# Patient Record
Sex: Male | Born: 1955 | Race: White | Hispanic: No | Smoking: Former smoker
Health system: Southern US, Community
[De-identification: ages and names within clinical notes are randomized; demographics above are authoritative.]

## PROBLEM LIST (undated history)

## (undated) DIAGNOSIS — C801 Malignant (primary) neoplasm, unspecified: Secondary | ICD-10-CM

## (undated) DIAGNOSIS — I10 Essential (primary) hypertension: Secondary | ICD-10-CM

## (undated) DIAGNOSIS — E119 Type 2 diabetes mellitus without complications: Secondary | ICD-10-CM

## (undated) HISTORY — PX: LYMPH NODE BIOPSY: SHX201

---

## 2013-11-14 DIAGNOSIS — C801 Malignant (primary) neoplasm, unspecified: Secondary | ICD-10-CM

## 2013-11-14 HISTORY — DX: Malignant (primary) neoplasm, unspecified: C80.1

## 2014-07-08 ENCOUNTER — Emergency Department (HOSPITAL_COMMUNITY): Payer: BLUE CROSS/BLUE SHIELD

## 2014-07-08 ENCOUNTER — Encounter (HOSPITAL_COMMUNITY): Payer: Self-pay | Admitting: Intensive Care

## 2014-07-08 ENCOUNTER — Emergency Department (HOSPITAL_COMMUNITY)
Admission: EM | Admit: 2014-07-08 | Discharge: 2014-07-08 | Disposition: A | Discharge status: Home / Self Care | Payer: BLUE CROSS/BLUE SHIELD | Attending: Emergency Medicine | Admitting: Emergency Medicine

## 2014-07-08 DIAGNOSIS — E119 Type 2 diabetes mellitus without complications: Secondary | ICD-10-CM | POA: Diagnosis not present

## 2014-07-08 DIAGNOSIS — Z79899 Other long term (current) drug therapy: Secondary | ICD-10-CM | POA: Diagnosis not present

## 2014-07-08 DIAGNOSIS — Z8589 Personal history of malignant neoplasm of other organs and systems: Secondary | ICD-10-CM | POA: Diagnosis not present

## 2014-07-08 DIAGNOSIS — R55 Syncope and collapse: Secondary | ICD-10-CM | POA: Insufficient documentation

## 2014-07-08 DIAGNOSIS — Z87891 Personal history of nicotine dependence: Secondary | ICD-10-CM | POA: Diagnosis not present

## 2014-07-08 HISTORY — DX: Type 2 diabetes mellitus without complications: E11.9

## 2014-07-08 HISTORY — DX: Malignant (primary) neoplasm, unspecified: C80.1

## 2014-07-08 LAB — COMPREHENSIVE METABOLIC PANEL
ALK PHOS: 45 U/L (ref 39–117)
ALT: 16 U/L (ref 0–53)
AST: 14 U/L (ref 0–37)
Albumin: 3.1 g/dL — ABNORMAL LOW (ref 3.5–5.2)
Anion gap: 8 (ref 5–15)
BUN: 17 mg/dL (ref 6–23)
CO2: 27 mmol/L (ref 19–32)
CREATININE: 0.85 mg/dL (ref 0.50–1.35)
Calcium: 8.7 mg/dL (ref 8.4–10.5)
Chloride: 97 mmol/L (ref 96–112)
GFR calc Af Amer: >90 mL/min (ref >90)
GLUCOSE: 168 mg/dL — AB (ref 70–99)
POTASSIUM: 3.1 mmol/L — AB (ref 3.5–5.1)
Sodium: 132 mmol/L — ABNORMAL LOW (ref 135–145)
TOTAL PROTEIN: 5 g/dL — AB (ref 6.0–8.3)
Total Bilirubin: 0.6 mg/dL (ref 0.3–1.2)

## 2014-07-08 LAB — URINALYSIS, ROUTINE W REFLEX MICROSCOPIC
BILIRUBIN URINE: NEGATIVE
Glucose, UA: NEGATIVE mg/dL
HGB URINE DIPSTICK: NEGATIVE
Ketones, ur: NEGATIVE mg/dL
LEUKOCYTES UA: NEGATIVE
Nitrite: NEGATIVE
Protein, ur: NEGATIVE mg/dL
SPECIFIC GRAVITY, URINE: 1.014 (ref 1.005–1.030)
UROBILINOGEN UA: 0.2 mg/dL (ref 0.0–1.0)
pH: 6.5 (ref 5.0–8.0)

## 2014-07-08 LAB — CBC WITH DIFFERENTIAL/PLATELET
BASOS PCT: 0 % (ref 0–1)
Basophils Absolute: 0 10*3/uL (ref 0.0–0.1)
EOS PCT: 1 % (ref 0–5)
Eosinophils Absolute: 0.1 10*3/uL (ref 0.0–0.7)
HEMATOCRIT: 32.7 % — AB (ref 39.0–52.0)
Hemoglobin: 11.4 g/dL — ABNORMAL LOW (ref 13.0–17.0)
LYMPHS ABS: 0.7 10*3/uL (ref 0.7–4.0)
Lymphocytes Relative: 6 % — ABNORMAL LOW (ref 12–46)
MCH: 31.2 pg (ref 26.0–34.0)
MCHC: 34.9 g/dL (ref 30.0–36.0)
MCV: 89.6 fL (ref 78.0–100.0)
MONO ABS: 0.1 10*3/uL (ref 0.1–1.0)
Monocytes Relative: 1 % — ABNORMAL LOW (ref 3–12)
Neutro Abs: 11.4 10*3/uL — ABNORMAL HIGH (ref 1.7–7.7)
Neutrophils Relative %: 92 % — ABNORMAL HIGH (ref 43–77)
PLATELETS: 150 10*3/uL (ref 150–400)
RBC: 3.65 MIL/uL — ABNORMAL LOW (ref 4.22–5.81)
RDW: 14.8 % (ref 11.5–15.5)
WBC: 12.2 10*3/uL — ABNORMAL HIGH (ref 4.0–10.5)

## 2014-07-08 LAB — CBG MONITORING, ED
Glucose-Capillary: 139 mg/dL — ABNORMAL HIGH (ref 70–99)
Glucose-Capillary: 140 mg/dL — ABNORMAL HIGH (ref 70–99)

## 2014-07-08 LAB — TROPONIN I: Troponin I: <0.03 ng/mL (ref <0.031)

## 2014-07-08 MED ORDER — SODIUM CHLORIDE 0.9 % IV SOLN
1000.0000 mL | INTRAVENOUS | Status: DC
  Administered 2014-07-08: 1000 mL via INTRAVENOUS

## 2014-07-08 NOTE — ED Notes (Signed)
Pt back from X-ray.  

## 2014-07-08 NOTE — ED Notes (Signed)
Arrived by [facility]. PT was in checkout line at [employer], pt stated to girlfriend he did not feel good and then passed out for 15-20 seconds losing LOC. Pt was pale and diaphoretic. EMS stated patient lost LOC for 10-15 seconds while with them. Per EMS patient was clammy and diaphoretic. Orthostatics with EMS Systolic 423 to 76, laying to sitting. Started new chemo pill 4 days go (Ibrutinib).Pt denies urinary symptoms, chx pain, N/V. Pt has discomfort R flank

## 2014-07-08 NOTE — ED Provider Notes (Signed)
CSN: [id number]     Arrival date & time 07/08/14  1411 History   First MD Initiated Contact with Patient 07/08/14 1507     Chief Complaint  Patient presents with  . Loss of Consciousness     (Consider location/radiation/quality/duration/timing/severity/associated sxs/prior Treatment) HPI patient presents after an episode of syncope. About one hour prior to my evaluation the patient was standing at a store. He felt lightheaded, diaphoretic, loss consciousness for 10/15 seconds. The patient was actually lowered to the floor, so no trauma. Upon awakening, and prior to the event he had no chest pain. He states that he returned to baseline cognition fairly quickly, and currently has no complaint. Patient has a notable history of non-Hodgkin's lymphoma, has completed several rounds of chemotherapy, and started a new oral chemotherapy regimen 4 days ago. He acknowledges a history of hypertension, but other than the cancer is generally well. History of coronary disease, arrhythmia. Patient is scheduled to see oncology tomorrow.  Past Medical History  Diagnosis Date  . Diabetes mellitus without complication   . Cancer    Past Surgical History  Procedure Laterality Date  . Lymph node biopsy     History reviewed. No pertinent family history. History  Substance Use Topics  . Smoking status: Former Research scientist (life sciences)  . Smokeless tobacco: Not on file  . Alcohol Use: Yes     Comment: occassionally    Review of Systems  Constitutional:       Per HPI, otherwise negative  HENT:       Per HPI, otherwise negative  Respiratory:       Per HPI, otherwise negative  Cardiovascular:       Per HPI, otherwise negative  Gastrointestinal: Negative for vomiting.  Endocrine:       Negative aside from HPI  Genitourinary:       Neg aside from HPI   Musculoskeletal:       Per HPI, otherwise negative  Skin: Negative.   Allergic/Immunologic: Positive for immunocompromised state.  Neurological: Positive for  syncope.      Allergies  Review of patient's allergies indicates not on file.  Home Medications   Prior to Admission medications   Medication Sig Start Date End Date Taking? Authorizing Provider  acetaminophen (TYLENOL) 500 MG tablet Take 1,000 mg by mouth every 6 (six) hours as needed.   Yes Historical Provider, MD  Cholecalciferol (VITAMIN D3) 5000 UNITS CAPS Take 5,000 Units by mouth daily.   Yes Historical Provider, MD  hydrochlorothiazide (HYDRODIURIL) 25 MG tablet Take 25 mg by mouth daily. 06/13/14  Yes Historical Provider, MD  ibrutinib (IMBRUVICA) 140 MG capsul Take 560 mg by mouth daily.   Yes Historical Provider, MD  losartan (COZAAR) 50 MG tablet Take 50 mg by mouth daily. 06/20/14  Yes Historical Provider, MD  metFORMIN (GLUCOPHAGE) 500 MG tablet Take 500 mg by mouth 2 (two) times daily with a meal. 06/20/14  Yes Historical Provider, MD  neomycin-bacitracin-polymyxin (NEOSPORIN) OINT Apply 1 application topically as needed for irritation or wound care.   Yes Historical Provider, MD  predniSONE (DELTASONE) 50 MG tablet Take 50 mg by mouth 2 (two) times daily. Takes for 5 days starting one day after chemo 03/31/14   Historical Provider, MD   BP 124/51 mmHg  Pulse 61  Temp(Src) 97.6 F (36.4 C) (Oral)  Resp 18  Ht 5\' 11"  (1.803 m)  Wt 200 lb (90.719 kg)  BMI 27.91 kg/m2  SpO2 100% Physical Exam  Constitutional: He is oriented to  person, place, and time. He appears well-developed. No distress.  HENT:  Head: Normocephalic and atraumatic.  Eyes: Conjunctivae and EOM are normal.  Cardiovascular: Normal rate and regular rhythm.   Pulmonary/Chest: Effort normal. No stridor. No respiratory distress.  Abdominal: He exhibits no distension.  Musculoskeletal: He exhibits no edema.  Neurological: He is alert and oriented to person, place, and time. No cranial nerve deficit. He exhibits normal muscle tone. Coordination normal.  Skin: Skin is warm and dry.  Psychiatric: He has a normal  mood and affect.  Nursing note and vitals reviewed.   ED Course  Procedures (including critical care time) Labs Review Labs Reviewed  CBC WITH DIFFERENTIAL/PLATELET - Abnormal; Notable for the following:    WBC 12.2 (*)    RBC 3.65 (*)    Hemoglobin 11.4 (*)    HCT 32.7 (*)    Neutrophils Relative % 92 (*)    Neutro Abs 11.4 (*)    Lymphocytes Relative 6 (*)    Monocytes Relative 1 (*)    All other components within normal limits  COMPREHENSIVE METABOLIC PANEL - Abnormal; Notable for the following:    Sodium 132 (*)    Potassium 3.1 (*)    Glucose, Bld 168 (*)    Total Protein 5.0 (*)    Albumin 3.1 (*)    All other components within normal limits  CBG MONITORING, ED - Abnormal; Notable for the following:    Glucose-Capillary 139 (*)    All other components within normal limits  CBG MONITORING, ED - Abnormal; Notable for the following:    Glucose-Capillary 140 (*)    All other components within normal limits  TROPONIN I  URINALYSIS, ROUTINE W REFLEX MICROSCOPIC  POCT CBG (FASTING - GLUCOSE)-MANUAL ENTRY    Imaging Review Dg Chest 2 View  07/08/2014   CLINICAL DATA:  Syncope. Receiving chemotherapy for Hodgkin's lymphoma.  EXAM: CHEST  2 VIEW  COMPARISON:  None.  FINDINGS: The heart size and mediastinal contours are within normal limits. Left-sided Port-A-Cath with the tip over the left brachiocephalic-SVC junction. Both lungs are clear. The visualized skeletal structures are unremarkable.  IMPRESSION: No active cardiopulmonary disease.   Electronically Signed   By: Kathreen Devoid   On: 07/08/2014 17:09     EKG Interpretation   Date/Time:  Sunday July 08 2014 14:17:00 EDT Ventricular Rate:  62 PR Interval:  159 QRS Duration: 103 QT Interval:  419 QTC Calculation: 425 R Axis:   78 Text Interpretation:  Sinus rhythm Consider left atrial enlargement  Nonspecific T abnormalities, lateral leads Minimal ST elevation, inferior  leads Sinus rhythm T wave abnormality  Abnormal ekg Confirmed by Carmin Muskrat  MD [phone]) on 07/08/2014 3:52:46 PM        EMS rhythm strip shows rate 98, T-wave changes, artifact, abnormal Pulse oximetry 100% room air normal Review of patient's chart demonstrates multiple oncology visits, no cardiac history.  I reviewed the results (including imaging as performed), agree with the interpretation  On repeat exam the patient appears better.  We reviewed all findings.   MDM   Final diagnoses:  Syncope and collapse    Patient, currently undergoing chemotherapy, presents after syncope. Here the patient is essentially a symptomatically, and for hours of monitoring has no arrhythmia, no evidence for occult infection. Patient may be feeling effects of recent hospitalization, with dehydration versus fatigue. No evidence for pulmonary embolism given the absence of hypoxia, tachypnea, tachycardia. Patient reason started new medication, which may be arrhythmogenic,  but again, no evidence for that currently. Patient has capacity for close follow-up, will call his oncologist, on discharge, be seen tomorrow for additional repeated interventions.     Carmin Muskrat, MD 07/08/14 [phone]

## 2014-07-08 NOTE — Discharge Instructions (Signed)
As discussed, it is important that you follow up as soon as possible with your physician for continued management of your condition.  With unclear cause for your loss of consciousness, the possibilities require additional evaluation. Please discuss this with your physician. You may consider continuous cardiac monitoring, echocardiogram, additional laboratory evaluation.  If you develop any new, or concerning changes in your condition, please return to the emergency department immediately.

## 2015-09-19 ENCOUNTER — Inpatient Hospital Stay (HOSPITAL_COMMUNITY)
Admission: EM | Admit: 2015-09-19 | Discharge: 2015-09-27 | DRG: 314 | Disposition: A | Discharge status: Skilled Nursing Facility | Payer: BLUE CROSS/BLUE SHIELD | Attending: Internal Medicine | Admitting: Internal Medicine

## 2015-09-19 ENCOUNTER — Encounter (HOSPITAL_COMMUNITY): Payer: Self-pay

## 2015-09-19 ENCOUNTER — Emergency Department (HOSPITAL_COMMUNITY): Payer: BLUE CROSS/BLUE SHIELD

## 2015-09-19 DIAGNOSIS — Z87891 Personal history of nicotine dependence: Secondary | ICD-10-CM

## 2015-09-19 DIAGNOSIS — M79606 Pain in leg, unspecified: Secondary | ICD-10-CM

## 2015-09-19 DIAGNOSIS — Z7984 Long term (current) use of oral hypoglycemic drugs: Secondary | ICD-10-CM

## 2015-09-19 DIAGNOSIS — E119 Type 2 diabetes mellitus without complications: Secondary | ICD-10-CM | POA: Diagnosis present

## 2015-09-19 DIAGNOSIS — Z515 Encounter for palliative care: Secondary | ICD-10-CM | POA: Diagnosis present

## 2015-09-19 DIAGNOSIS — Z880 Allergy status to penicillin: Secondary | ICD-10-CM

## 2015-09-19 DIAGNOSIS — A419 Sepsis, unspecified organism: Secondary | ICD-10-CM | POA: Diagnosis not present

## 2015-09-19 DIAGNOSIS — R609 Edema, unspecified: Secondary | ICD-10-CM

## 2015-09-19 DIAGNOSIS — M79651 Pain in right thigh: Secondary | ICD-10-CM

## 2015-09-19 DIAGNOSIS — C8330 Diffuse large B-cell lymphoma, unspecified site: Secondary | ICD-10-CM | POA: Diagnosis present

## 2015-09-19 DIAGNOSIS — I959 Hypotension, unspecified: Secondary | ICD-10-CM | POA: Diagnosis not present

## 2015-09-19 DIAGNOSIS — M25551 Pain in right hip: Secondary | ICD-10-CM | POA: Diagnosis present

## 2015-09-19 DIAGNOSIS — M79604 Pain in right leg: Secondary | ICD-10-CM | POA: Diagnosis not present

## 2015-09-19 DIAGNOSIS — Z881 Allergy status to other antibiotic agents status: Secondary | ICD-10-CM

## 2015-09-19 DIAGNOSIS — E861 Hypovolemia: Secondary | ICD-10-CM | POA: Diagnosis present

## 2015-09-19 DIAGNOSIS — Z79891 Long term (current) use of opiate analgesic: Secondary | ICD-10-CM

## 2015-09-19 DIAGNOSIS — J90 Pleural effusion, not elsewhere classified: Secondary | ICD-10-CM | POA: Diagnosis present

## 2015-09-19 DIAGNOSIS — G893 Neoplasm related pain (acute) (chronic): Secondary | ICD-10-CM | POA: Diagnosis not present

## 2015-09-19 DIAGNOSIS — F05 Delirium due to known physiological condition: Secondary | ICD-10-CM | POA: Diagnosis not present

## 2015-09-19 DIAGNOSIS — T80219A Unspecified infection due to central venous catheter, initial encounter: Secondary | ICD-10-CM | POA: Diagnosis present

## 2015-09-19 DIAGNOSIS — Z79899 Other long term (current) drug therapy: Secondary | ICD-10-CM

## 2015-09-19 DIAGNOSIS — I9589 Other hypotension: Secondary | ICD-10-CM

## 2015-09-19 DIAGNOSIS — E872 Acidosis, unspecified: Secondary | ICD-10-CM | POA: Diagnosis present

## 2015-09-19 DIAGNOSIS — T80211A Bloodstream infection due to central venous catheter, initial encounter: Principal | ICD-10-CM | POA: Diagnosis present

## 2015-09-19 DIAGNOSIS — Z7189 Other specified counseling: Secondary | ICD-10-CM | POA: Diagnosis present

## 2015-09-19 DIAGNOSIS — R627 Adult failure to thrive: Secondary | ICD-10-CM | POA: Diagnosis present

## 2015-09-19 DIAGNOSIS — Z801 Family history of malignant neoplasm of trachea, bronchus and lung: Secondary | ICD-10-CM

## 2015-09-19 DIAGNOSIS — E871 Hypo-osmolality and hyponatremia: Secondary | ICD-10-CM | POA: Diagnosis present

## 2015-09-19 DIAGNOSIS — B999 Unspecified infectious disease: Secondary | ICD-10-CM | POA: Diagnosis present

## 2015-09-19 DIAGNOSIS — Z808 Family history of malignant neoplasm of other organs or systems: Secondary | ICD-10-CM

## 2015-09-19 DIAGNOSIS — I358 Other nonrheumatic aortic valve disorders: Secondary | ICD-10-CM | POA: Diagnosis present

## 2015-09-19 DIAGNOSIS — X58XXXA Exposure to other specified factors, initial encounter: Secondary | ICD-10-CM | POA: Diagnosis present

## 2015-09-19 DIAGNOSIS — S90822A Blister (nonthermal), left foot, initial encounter: Secondary | ICD-10-CM | POA: Diagnosis present

## 2015-09-19 DIAGNOSIS — I10 Essential (primary) hypertension: Secondary | ICD-10-CM | POA: Diagnosis present

## 2015-09-19 DIAGNOSIS — A4101 Sepsis due to Methicillin susceptible Staphylococcus aureus: Secondary | ICD-10-CM | POA: Diagnosis present

## 2015-09-19 DIAGNOSIS — Z807 Family history of other malignant neoplasms of lymphoid, hematopoietic and related tissues: Secondary | ICD-10-CM

## 2015-09-19 DIAGNOSIS — R634 Abnormal weight loss: Secondary | ICD-10-CM | POA: Diagnosis present

## 2015-09-19 DIAGNOSIS — I890 Lymphedema, not elsewhere classified: Secondary | ICD-10-CM | POA: Diagnosis present

## 2015-09-19 DIAGNOSIS — I110 Hypertensive heart disease with heart failure: Secondary | ICD-10-CM | POA: Diagnosis present

## 2015-09-19 DIAGNOSIS — Z66 Do not resuscitate: Secondary | ICD-10-CM | POA: Diagnosis present

## 2015-09-19 DIAGNOSIS — I5020 Unspecified systolic (congestive) heart failure: Secondary | ICD-10-CM | POA: Diagnosis present

## 2015-09-19 DIAGNOSIS — R6521 Severe sepsis with septic shock: Secondary | ICD-10-CM | POA: Diagnosis present

## 2015-09-19 LAB — HEPATIC FUNCTION PANEL
ALK PHOS: 42 U/L (ref 25–125)
ALK PHOS: 42 U/L (ref 38–126)
ALT: 25 U/L (ref 10–40)
ALT: 25 U/L (ref 17–63)
AST: 31 U/L (ref 14–40)
AST: 31 U/L (ref 15–41)
Albumin: 3.6 g/dL (ref 3.5–5.0)
BILIRUBIN DIRECT: 0.2 mg/dL (ref 0.1–0.5)
BILIRUBIN, TOTAL: 0.6 mg/dL
Indirect Bilirubin: 0.4 mg/dL (ref 0.3–0.9)
TOTAL PROTEIN: 6.2 g/dL — AB (ref 6.5–8.1)
Total Bilirubin: 0.6 mg/dL (ref 0.3–1.2)

## 2015-09-19 LAB — CBC WITH DIFFERENTIAL/PLATELET
BASOS PCT: 0 %
Basophils Absolute: 0 10*3/uL (ref 0.0–0.1)
EOS ABS: 0 10*3/uL (ref 0.0–0.7)
Eosinophils Relative: 0 %
HCT: 29.4 % — ABNORMAL LOW (ref 39.0–52.0)
Hemoglobin: 10.2 g/dL — ABNORMAL LOW (ref 13.0–17.0)
Lymphocytes Relative: 1 %
Lymphs Abs: 0.1 10*3/uL — ABNORMAL LOW (ref 0.7–4.0)
MCH: 30.7 pg (ref 26.0–34.0)
MCHC: 34.7 g/dL (ref 30.0–36.0)
MCV: 88.6 fL (ref 78.0–100.0)
Monocytes Absolute: 0.2 10*3/uL (ref 0.1–1.0)
Monocytes Relative: 2 %
Neutro Abs: 9.2 10*3/uL — ABNORMAL HIGH (ref 1.7–7.7)
Neutrophils Relative %: 97 %
Platelets: 194 10*3/uL (ref 150–400)
RBC: 3.32 MIL/uL — AB (ref 4.22–5.81)
RDW: 15.3 % (ref 11.5–15.5)
WBC: 9.5 10*3/uL (ref 4.0–10.5)

## 2015-09-19 LAB — BLOOD CULTURE ID PANEL (REFLEXED)
ACINETOBACTER BAUMANNII: NOT DETECTED
CANDIDA KRUSEI: NOT DETECTED
CANDIDA PARAPSILOSIS: NOT DETECTED
CARBAPENEM RESISTANCE: NOT DETECTED
Candida albicans: NOT DETECTED
Candida glabrata: NOT DETECTED
Candida tropicalis: NOT DETECTED
ENTEROBACTERIACEAE SPECIES: NOT DETECTED
ENTEROCOCCUS SPECIES: NOT DETECTED
Enterobacter cloacae complex: NOT DETECTED
Escherichia coli: NOT DETECTED
Haemophilus influenzae: NOT DETECTED
KLEBSIELLA OXYTOCA: NOT DETECTED
KLEBSIELLA PNEUMONIAE: NOT DETECTED
LISTERIA MONOCYTOGENES: NOT DETECTED
Methicillin resistance: NOT DETECTED
Neisseria meningitidis: NOT DETECTED
Proteus species: NOT DETECTED
Pseudomonas aeruginosa: NOT DETECTED
SERRATIA MARCESCENS: NOT DETECTED
STAPHYLOCOCCUS AUREUS BCID: DETECTED — AB
STAPHYLOCOCCUS SPECIES: DETECTED — AB
Streptococcus agalactiae: NOT DETECTED
Streptococcus pneumoniae: NOT DETECTED
Streptococcus pyogenes: NOT DETECTED
Streptococcus species: NOT DETECTED
VANCOMYCIN RESISTANCE: NOT DETECTED

## 2015-09-19 LAB — BASIC METABOLIC PANEL
Anion gap: 12 (ref 5–15)
BUN: 27 mg/dL — ABNORMAL HIGH (ref 6–20)
BUN: 27 mg/dL — AB (ref 4–21)
CO2: 23 mmol/L (ref 22–32)
CREATININE: 0.72 mg/dL (ref 0.61–1.24)
Calcium: 9.9 mg/dL (ref 8.9–10.3)
Chloride: 94 mmol/L — ABNORMAL LOW (ref 101–111)
Creatinine: 0.7 mg/dL (ref 0.6–1.3)
GFR calc non Af Amer: >60 mL/min (ref >60)
Glucose, Bld: 72 mg/dL (ref 65–99)
Glucose: 72 mg/dL
Potassium: 3.6 mmol/L (ref 3.4–5.3)
Potassium: 3.6 mmol/L (ref 3.5–5.1)
SODIUM: 129 mmol/L — AB (ref 135–145)
SODIUM: 129 mmol/L — AB (ref 137–147)

## 2015-09-19 LAB — CBC AND DIFFERENTIAL
HEMATOCRIT: 29 % — AB (ref 41–53)
HEMOGLOBIN: 10.2 g/dL — AB (ref 13.5–17.5)
PLATELETS: 194 10*3/uL (ref 150–399)
WBC: 9.5 10^3/mL

## 2015-09-19 LAB — URINALYSIS, ROUTINE W REFLEX MICROSCOPIC
Bilirubin Urine: NEGATIVE
GLUCOSE, UA: NEGATIVE mg/dL
Hgb urine dipstick: NEGATIVE
Ketones, ur: NEGATIVE mg/dL
LEUKOCYTES UA: NEGATIVE
Nitrite: NEGATIVE
PROTEIN: NEGATIVE mg/dL
SPECIFIC GRAVITY, URINE: 1.02 (ref 1.005–1.030)
pH: 6 (ref 5.0–8.0)

## 2015-09-19 LAB — I-STAT CG4 LACTIC ACID, ED
LACTIC ACID, VENOUS: 4.51 mmol/L — AB (ref 0.5–1.9)
LACTIC ACID, VENOUS: 3.35 mmol/L — AB (ref 0.5–1.9)

## 2015-09-19 LAB — ACETAMINOPHEN LEVEL

## 2015-09-19 MED ORDER — FENTANYL CITRATE (PF) 100 MCG/2ML IJ SOLN
50.0000 ug | INTRAMUSCULAR | Status: DC | PRN
  Administered 2015-09-19 (×2): 50 ug via INTRAVENOUS
  Filled 2015-09-19 (×3): qty 2

## 2015-09-19 MED ORDER — HYDROMORPHONE HCL 1 MG/ML IJ SOLN
1.0000 mg | Freq: Once | INTRAMUSCULAR | Status: AC
  Administered 2015-09-19: 1 mg via INTRAVENOUS
  Filled 2015-09-19: qty 1

## 2015-09-19 MED ORDER — DIPHENHYDRAMINE HCL 12.5 MG/5ML PO ELIX: 12.5000 mg | ORAL_SOLUTION | Freq: Four times a day (QID) | ORAL | Status: DC | PRN

## 2015-09-19 MED ORDER — DIPHENHYDRAMINE HCL 50 MG/ML IJ SOLN: 12.5000 mg | Freq: Four times a day (QID) | INTRAMUSCULAR | Status: DC | PRN

## 2015-09-19 MED ORDER — ONDANSETRON HCL 4 MG/2ML IJ SOLN: 4.0000 mg | Freq: Four times a day (QID) | INTRAMUSCULAR | Status: DC | PRN

## 2015-09-19 MED ORDER — ONDANSETRON HCL 4 MG PO TABS: 4.0000 mg | ORAL_TABLET | Freq: Four times a day (QID) | ORAL | Status: DC | PRN

## 2015-09-19 MED ORDER — SODIUM CHLORIDE 0.9 % IV BOLUS (SEPSIS)
1000.0000 mL | Freq: Once | INTRAVENOUS | Status: AC
  Administered 2015-09-19: 1000 mL via INTRAVENOUS

## 2015-09-19 MED ORDER — PIPERACILLIN-TAZOBACTAM 3.375 G IVPB 30 MIN
3.3750 g | Freq: Once | INTRAVENOUS | Status: AC
  Administered 2015-09-19: 3.375 g via INTRAVENOUS
  Filled 2015-09-19: qty 50

## 2015-09-19 MED ORDER — DEXAMETHASONE SODIUM PHOSPHATE 10 MG/ML IJ SOLN
8.0000 mg | Freq: Four times a day (QID) | INTRAMUSCULAR | Status: DC
  Administered 2015-09-19 – 2015-09-21 (×7): 8 mg via INTRAVENOUS
  Administered 2015-09-21: 10 mg via INTRAVENOUS
  Filled 2015-09-19 (×8): qty 1

## 2015-09-19 MED ORDER — SODIUM CHLORIDE 0.9 % IV BOLUS (SEPSIS)
700.0000 mL | Freq: Once | INTRAVENOUS | Status: AC
  Administered 2015-09-19: 700 mL via INTRAVENOUS

## 2015-09-19 MED ORDER — NALOXONE HCL 0.4 MG/ML IJ SOLN: 0.4000 mg | INTRAMUSCULAR | Status: DC | PRN

## 2015-09-19 MED ORDER — FENTANYL 50 MCG/HR TD PT72
50.0000 ug | MEDICATED_PATCH | TRANSDERMAL | Status: DC
  Administered 2015-09-19 – 2015-09-25 (×3): 50 ug via TRANSDERMAL
  Filled 2015-09-19: qty 2
  Filled 2015-09-19 (×2): qty 1

## 2015-09-19 MED ORDER — LINEZOLID 600 MG/300ML IV SOLN
600.0000 mg | Freq: Two times a day (BID) | INTRAVENOUS | Status: DC
  Administered 2015-09-19: 600 mg via INTRAVENOUS
  Filled 2015-09-19 (×2): qty 300

## 2015-09-19 MED ORDER — SODIUM CHLORIDE 0.9 % IV SOLN
INTRAVENOUS | Status: DC
  Administered 2015-09-19 – 2015-09-25 (×7): via INTRAVENOUS

## 2015-09-19 MED ORDER — DEXTROSE 5 % IV SOLN
2.0000 g | Freq: Once | INTRAVENOUS | Status: AC
  Administered 2015-09-19: 2 g via INTRAVENOUS
  Filled 2015-09-19 (×2): qty 2

## 2015-09-19 MED ORDER — SODIUM CHLORIDE 0.9 % IV BOLUS (SEPSIS): 1000.0000 mL | Freq: Once | INTRAVENOUS | Status: DC

## 2015-09-19 MED ORDER — DEXAMETHASONE 4 MG PO TABS: 4.0000 mg | ORAL_TABLET | Freq: Two times a day (BID) | ORAL | Status: DC

## 2015-09-19 MED ORDER — LEVOFLOXACIN IN D5W 750 MG/150ML IV SOLN
750.0000 mg | INTRAVENOUS | Status: DC
  Administered 2015-09-19: 750 mg via INTRAVENOUS
  Filled 2015-09-19: qty 150

## 2015-09-19 MED ORDER — FENTANYL CITRATE (PF) 100 MCG/2ML IJ SOLN
50.0000 ug | Freq: Once | INTRAMUSCULAR | Status: AC
  Administered 2015-09-19: 50 ug via INTRAVENOUS

## 2015-09-19 MED ORDER — ENOXAPARIN SODIUM 40 MG/0.4ML ~~LOC~~ SOLN
40.0000 mg | SUBCUTANEOUS | Status: DC
  Administered 2015-09-19: 40 mg via SUBCUTANEOUS
  Filled 2015-09-19: qty 0.4

## 2015-09-19 MED ORDER — DEXTROSE 5 % IV SOLN
1.0000 g | Freq: Three times a day (TID) | INTRAVENOUS | Status: DC
  Filled 2015-09-19 (×2): qty 1

## 2015-09-19 MED ORDER — ONDANSETRON HCL 4 MG/2ML IJ SOLN
4.0000 mg | Freq: Once | INTRAMUSCULAR | Status: AC
  Administered 2015-09-19: 4 mg via INTRAVENOUS
  Filled 2015-09-19: qty 2

## 2015-09-19 MED ORDER — VANCOMYCIN HCL IN DEXTROSE 1-5 GM/200ML-% IV SOLN: 1000.0000 mg | Freq: Two times a day (BID) | INTRAVENOUS | Status: DC

## 2015-09-19 MED ORDER — DEXAMETHASONE SODIUM PHOSPHATE 4 MG/ML IJ SOLN: 4.0000 mg | Freq: Two times a day (BID) | INTRAMUSCULAR | Status: DC

## 2015-09-19 MED ORDER — FENTANYL 40 MCG/ML IV SOLN
INTRAVENOUS | Status: DC
  Administered 2015-09-19: 25 ug via INTRAVENOUS
  Administered 2015-09-19: 17:00:00 via INTRAVENOUS
  Administered 2015-09-20: 0 ug via INTRAVENOUS
  Administered 2015-09-20: 0.1 ug via INTRAVENOUS
  Administered 2015-09-21: 25 ug via INTRAVENOUS
  Administered 2015-09-21: 0 ug via INTRAVENOUS
  Filled 2015-09-19: qty 25

## 2015-09-19 MED ORDER — SODIUM CHLORIDE 0.9% FLUSH: 9.0000 mL | INTRAVENOUS | Status: DC | PRN

## 2015-09-19 MED ORDER — PIPERACILLIN-TAZOBACTAM 3.375 G IVPB: 3.3750 g | Freq: Three times a day (TID) | INTRAVENOUS | Status: DC

## 2015-09-19 MED ORDER — OXYCODONE HCL 5 MG PO TABS: 5.0000 mg | ORAL_TABLET | ORAL | Status: DC | PRN

## 2015-09-19 MED ORDER — VANCOMYCIN HCL IN DEXTROSE 1-5 GM/200ML-% IV SOLN
1000.0000 mg | Freq: Once | INTRAVENOUS | Status: AC
  Administered 2015-09-19: 1000 mg via INTRAVENOUS
  Filled 2015-09-19: qty 200

## 2015-09-19 MED ORDER — DEXAMETHASONE 2 MG PO TABS: 2.0000 mg | ORAL_TABLET | Freq: Two times a day (BID) | ORAL | Status: DC

## 2015-09-19 MED ORDER — METHYLPREDNISOLONE SODIUM SUCC 125 MG IJ SOLR
60.0000 mg | Freq: Four times a day (QID) | INTRAMUSCULAR | Status: DC
  Administered 2015-09-19: 60 mg via INTRAVENOUS
  Filled 2015-09-19: qty 2

## 2015-09-19 MED ORDER — SODIUM CHLORIDE 0.9 % IV SOLN
Freq: Once | INTRAVENOUS | Status: AC
  Administered 2015-09-19: 12:00:00 via INTRAVENOUS

## 2015-09-19 MED ORDER — ACETAMINOPHEN 325 MG PO TABS
650.0000 mg | ORAL_TABLET | Freq: Four times a day (QID) | ORAL | Status: DC | PRN
  Filled 2015-09-19: qty 2

## 2015-09-19 MED ORDER — FENTANYL 40 MCG/ML IV SOLN
INTRAVENOUS | Status: DC
  Filled 2015-09-19: qty 25

## 2015-09-19 MED ORDER — ACETAMINOPHEN 650 MG RE SUPP: 650.0000 mg | Freq: Four times a day (QID) | RECTAL | Status: DC | PRN

## 2015-09-19 MED ORDER — FAMOTIDINE IN NACL 20-0.9 MG/50ML-% IV SOLN
20.0000 mg | Freq: Two times a day (BID) | INTRAVENOUS | Status: DC
  Administered 2015-09-19 – 2015-09-24 (×10): 20 mg via INTRAVENOUS
  Filled 2015-09-19 (×11): qty 50

## 2015-09-19 NOTE — ED Notes (Signed)
Pt. Is unable to urinate at this time. Urinal left at bedside. 

## 2015-09-19 NOTE — ED Provider Notes (Signed)
CSN: [id number]     Arrival date & time 09/19/15  0301 History   First MD Initiated Contact with Patient 09/19/15 [phone]     Chief Complaint  Patient presents with  . Leg Pain     (Consider location/radiation/quality/duration/timing/severity/associated sxs/prior Treatment) HPI Martin Norton is a 60 y.o. male with history of diabetes, non-Hodgkin's lymphoma associated pain in right leg presents for evaluation of acute leg pain. States this is typical pain pattern for him. Patient reports symptoms have been gradually worsening today, not relieved with fentanyl patch, oxycodone, Aleve and Tylenol at home. Given 150 mg of fentanyl via EMS without relief. Patient typically [facility]. Denies fevers, chills, nausea or vomiting. Reports he is here for pain control.  Next Chemotherapy session is next week. Wife at bedside notes that he takes 25 g fentanyl patch changed every other day, also when necessary oxycodone. She does not know the dose.  Past Medical History  Diagnosis Date  . Diabetes mellitus without complication   . Cancer    Past Surgical History  Procedure Laterality Date  . Lymph node biopsy     No family history on file. Social History  Substance Use Topics  . Smoking status: Former Research scientist (life sciences)  . Smokeless tobacco: Not on file  . Alcohol Use: Yes     Comment: occassionally    Review of Systems A 10 point review of systems was completed and was negative except for pertinent positives and negatives as mentioned in the history of present illness     Allergies  Review of patient's allergies indicates not on file.  Home Medications   Prior to Admission medications   Medication Sig Start Date End Date Taking? Authorizing Provider  acetaminophen (TYLENOL) 500 MG tablet Take 1,000 mg by mouth every 6 (six) hours as needed.    Historical Provider, MD  Cholecalciferol (VITAMIN D3) 5000 UNITS CAPS Take 5,000 Units by mouth daily.    Historical  Provider, MD  hydrochlorothiazide (HYDRODIURIL) 25 MG tablet Take 25 mg by mouth daily. 06/13/14   Historical Provider, MD  ibrutinib (IMBRUVICA) 140 MG capsul Take 560 mg by mouth daily.    Historical Provider, MD  losartan (COZAAR) 50 MG tablet Take 50 mg by mouth daily. 06/20/14   Historical Provider, MD  metFORMIN (GLUCOPHAGE) 500 MG tablet Take 500 mg by mouth 2 (two) times daily with a meal. 06/20/14   Historical Provider, MD  neomycin-bacitracin-polymyxin (NEOSPORIN) OINT Apply 1 application topically as needed for irritation or wound care.    Historical Provider, MD  predniSONE (DELTASONE) 50 MG tablet Take 50 mg by mouth 2 (two) times daily. Takes for 5 days starting one day after chemo 03/31/14   Historical Provider, MD   BP 115/83 mmHg  Pulse 118  Temp(Src) 98.4 F (36.9 C) (Oral)  SpO2 100% Physical Exam  Constitutional: He is oriented to person, place, and time. He appears well-developed and well-nourished.  HENT:  Head: Normocephalic and atraumatic.  Mouth/Throat: Oropharynx is clear and moist.  Eyes: Conjunctivae are normal. Pupils are equal, round, and reactive to light. Right eye exhibits no discharge. Left eye exhibits no discharge. No scleral icterus.  Neck: Neck supple.  Cardiovascular: Normal rate, regular rhythm and normal heart sounds.   Pulmonary/Chest: Effort normal and breath sounds normal. No respiratory distress. He has no wheezes. He has no rales.  Abdominal: Soft. There is no tenderness.  Musculoskeletal: He exhibits no tenderness.  Diffuse lymphedema about left lower extremity  which is baseline. Tenderness is located in right anterior thigh. Muscle compartments are soft. No erythema, induration. Full active range of motion  Neurological: He is alert and oriented to person, place, and time.  Cranial Nerves II-XII grossly intact. Motor strength is 5/5 in all 4 extremities. Sensation intact to light touch. No ataxia  Skin: Skin is warm and dry. No rash noted.   Psychiatric: He has a normal mood and affect.  Nursing note and vitals reviewed.   ED Course  Procedures (including critical care time) Labs Review Labs Reviewed  BASIC METABOLIC PANEL - Abnormal; Notable for the following:    Sodium 129 (*)    Chloride 94 (*)    BUN 27 (*)    All other components within normal limits  CBC WITH DIFFERENTIAL/PLATELET - Abnormal; Notable for the following:    RBC 3.32 (*)    Hemoglobin 10.2 (*)    HCT 29.4 (*)    Neutro Abs 9.2 (*)    Lymphs Abs 0.1 (*)    All other components within normal limits    Imaging Review No results found. I have personally reviewed and evaluated these images and lab results as part of my medical decision-making.   EKG Interpretation None      MDM  Patient with left leg lymphedema secondary to non-Hodgkin's lymphoma with acute right anterior thigh pain. On arrival, hemodynamically stable, afebrile, mild tachycardia-likely secondary to pain. Physical exam is unremarkable and not concerning for DVT, compartment syndrome or other emergent pathology. Attempted pain control and emergency department with 3 g of Dilaudid, patient denies any significant relief. Patient may benefit from overnight medical admission for pain control. Discussed with my attending, Dr. Roxanne Mins, who agrees with above plan. Discussed with hospitalist, Dr. Hal Hope, requests MR imaging to further elucidate source of patient's pain. Patient care signed out to oncoming provider for follow-up on MR imaging and subsequent medical admission for pain control. Final diagnoses:  Right thigh pain        Comer Locket, PA-C [phone] [id number]  Delora Fuel, MD [id number] [phone]

## 2015-09-19 NOTE — ED Notes (Signed)
Pt brought in by EMS with complaints of right leg pain that has not improved with prescribed medications at home.  Pt has a hx of chronic lymphedema. Pt usually has tx for lymphedema at [facility]. 150mg  of Fentayl given by EMS. +2-3 edema noted in BLE.

## 2015-09-19 NOTE — Progress Notes (Signed)
      INFECTIOUS DISEASE ATTENDING ADDENDUM:   Date: 09/19/2015  Patient name: Martin Norton  Medical record number: [id number]  Date of birth: [birth year]    Patient with MSSA bacteremia.   He has reported "lip swelling" with zosyn. Not sure if pt has had ceph since the but will continue zyvox for now  Will see formally in the AM   [facility] 09/19/2015, 10:34 PM

## 2015-09-19 NOTE — ED Notes (Signed)
PA at bedside,  

## 2015-09-19 NOTE — ED Notes (Signed)
Hospitalist at bedside 

## 2015-09-19 NOTE — ED Provider Notes (Signed)
6:12 AM Pt signed out to me at shift change. Pt with hx of lymphoma, here with right leg pain. Pt with hx of chronic lympadema, caused by a pelvic mass, followed by [facility]. Currently on Chemo, last chemo on 6/22. Was recently seen by PCP at [facility], records reviewed, for this same pain. Currently on fentanyl and oxycodone. Pain is not controlled at home. Received 170mcg of fentanyl by EMS. Pt received total of 3mg  of dilaudid here in 1mg  increments. Admission was attempted, but hospitalist requested MR lumbar spine and leg.   Just got approached by RN, blood pressue 65/42, same on recheck. Will give IV bolus.  Pt states his pain is "OK" at this time. Pt states "I know my pain is from my cancer, its all in my pelvis and hips, and they told me I have about 3 months to live, but right now, I cant take this pain." MRI pending.   8:10 AM BP still in90s after 2L. Ordered lactic acid and blood cultures, CXR. Will call for admission.  8:46 AM Spoke with hospitalist. Refuse admission until lactic acid is back. Asked to call back.   8:55 AM Lactic acid is 4.5. Sepsis called. Antibiotics ordered. BP now 143/132? Seems inaccurate, will recheck.   9:40 AM Pt's manual blood pressure in [id number] systolic, however 0000000 manually. He is receiving IV fluids and antibiotics. Pt continues to complain of pain in right hip and thigh. He has no other complaints or source for possible infection. WBC normal. Pt states he has not been eating or drinking well. Will admit for further evaluation and tx.   i did confirm with pt. Wants all treatment including pressors except for Intubation and CPR.   Repeat sepsis assessment completed.    10:00 AM I initially spoke with hospitalist, concerned about elevated lactic acid and still low blood pressures in [id number] systolic. Asked to speak with critical care. I did speak with critical care. They will come by and see patient.  Critical care seen patient. They confirmed with patient  that he now does not want any pressors. They recommended admission to palliative floor to medicine. They will contact [facility] for admission.  Filed Vitals:   09/19/15 1336 09/19/15 1400 09/19/15 1430 09/19/15 1500  BP: 87/61 101/42 92/56 73/40   Pulse: 114 117 108 106  Temp:      TempSrc:      Resp: 12 20 18 19   Weight:      SpO2: 94% 93% 94% 97%     Jeannett Senior, PA-C [id number]  David Glick, MD [id number] [phone]

## 2015-09-19 NOTE — ED Notes (Signed)
PA made aware of BP. Bolus ordered and initiated

## 2015-09-19 NOTE — ED Notes (Signed)
Bed: [id number] Expected date:  Expected time:  Means of arrival:  Comments: Chronic pain, cancer patient

## 2015-09-19 NOTE — Consult Note (Signed)
Consultation Note Date: 09/19/2015   Patient Name: Martin Norton  DOB: Nov 25, 1955  MRN: [id number]  Age / Sex: 60 y.o., male  PCP: Red Christians, MD Referring Physician: Elmarie Shiley, MD  Reason for Consultation: Pain control  HPI/Patient Profile: 60 y.o. male  with past medical history of DM II, HTN,  refractory diffuse large B-Cell Lymphoma treated at [facility], has had extensive treatment for lymphoma presented on 09/19/2015 with intractable lower extremity pain.   Clinical Assessment and Goals of Care: I spoke earlier today with Noe Gens from [facility]. Adjustments at that time included increasing fentanyl patch to 37mg and adjusting rescue medications. We discussed case and based upon his continued uncontrolled pain and unknown need to control pain, I recommended initiation of PCA.    I met today with patient and his girlfriend/HCPOA at the bedside.  At time of encounter, PCA was just being set up and he reported that he currently had no pain.  We discussed pain prior to coming to the hospital and he reports that he was using fentanyl 226m patch and 76m40mxycodone with no relief.  No relief with dilaudid in ED and partial relief with IV push of fentanyl.  He reports in the past that he had somewhat similar pain that had responded to steroids.  He is still taking dexamethasone 2mg80mally daily.  He thinks that he may be feeling better now as he had higher dose of steroids in ED.   SUMMARY OF RECOMMENDATIONS   - Patient with pain much better controlled at time of my encounter.  It has been a few hours since his last rescue dose.  I am not sure if his pain is improved secondary to receiving steroids or increase in fentanyl patch, but will plan to continue with PCA overnight in order to get a clearer understanding of his opioid needs.  Fentanyl 276mc41mlus with 15 minute lockout. - His long term girlfriend is his  HCPOA.  Copy of this placed on chart. - Plan to continue with current therapy and see how he responds.  Plan for family meeting at 1130 38rrow morning to discuss long term goals moving forward.  No escalation of care if he decompensates. - He understands that increasing doses of opioids has potential for decreasing BP.  His main focus moving forward is to ensure he is as pain free and comfortable as possible.    Code Status/Advance Care Planning:  DNR   Symptom Management:   As above  Palliative Prophylaxis:   Frequent Pain Assessment  Psycho-social/Spiritual:   Desire for further Chaplaincy support:Did not address this visit  Additional Recommendations: Caregiving  Support/Resources  Prognosis:   Unable to determine, but he remains at high risk for continued decompensation and death  Discharge Planning: To Be Determined      Primary Diagnoses: Present on Admission:  . Right thigh pain . Diffuse large B-cell lymphoma (HCC) [place]ial hypotension . Lactic acid acidosis . Hypotension  I have reviewed the medical record, interviewed the patient  and family, and examined the patient. The following aspects are pertinent.  Past Medical History  Diagnosis Date  . Diabetes mellitus without complication ([place])   . Cancer [facility])    Social History   Social History  . Marital Status: Divorced    Spouse Name: N/A  . Number of Children: N/A  . Years of Education: N/A   Social History Main Topics  . Smoking status: Former Smoker -- 0.50 packs/day for 25 years    Types: Cigarettes  . Smokeless tobacco: Never Used  . Alcohol Use: 1.8 oz/week    3 Cans of beer per week     Comment: occassionally  . Drug Use: No  . Sexual Activity: Not Currently   Other Topics Concern  . None   Social History Narrative   Family History  Problem Relation Age of Onset  . Lymphoma Mother   . Lung cancer Father   . Throat cancer Brother    Scheduled Meds: . dexamethasone  8 mg  Intravenous Q6H  . enoxaparin (LOVENOX) injection  40 mg Subcutaneous Q24H  . famotidine (PEPCID) IV  20 mg Intravenous Q12H  . fentaNYL  50 mcg Transdermal Q72H  . fentaNYL   Intravenous Q4H  . linezolid (ZYVOX) IV  600 mg Intravenous Q12H   Continuous Infusions: . sodium chloride 100 mL/hr at 09/19/15 2222   PRN Meds:.acetaminophen **OR** acetaminophen, diphenhydrAMINE **OR** diphenhydrAMINE, ondansetron (ZOFRAN) IV, ondansetron **OR** ondansetron (ZOFRAN) IV Medications Prior to Admission:  Prior to Admission medications   Medication Sig Start Date End Date Taking? Authorizing Provider  acetaminophen (TYLENOL) 500 MG tablet Take 1,000 mg by mouth every 6 (six) hours as needed for mild pain.    Yes Historical Provider, MD  Cholecalciferol (VITAMIN D3) 5000 UNITS CAPS Take 5,000 Units by mouth daily.   Yes Historical Provider, MD  dexamethasone (DECADRON) 1 MG tablet Take 2 mg by mouth 2 (two) times daily with a meal.  08/19/15  Yes Historical Provider, MD  fentaNYL (DURAGESIC) 25 MCG/HR patch Place 25 mcg onto the skin every 3 (three) days. 09/17/15  Yes Historical Provider, MD  losartan (COZAAR) 50 MG tablet Take 50 mg by mouth daily after supper.  06/20/14  Yes Historical Provider, MD  metFORMIN (GLUCOPHAGE) 500 MG tablet Take 500 mg by mouth 2 (two) times daily with a meal. 06/20/14  Yes Historical Provider, MD  oxyCODONE (OXY IR/ROXICODONE) 5 MG immediate release tablet Take 5 mg by mouth every 4 (four) hours as needed for moderate pain.    Yes Historical Provider, MD  traMADol (ULTRAM) 50 MG tablet Take 50 mg by mouth every 6 (six) hours as needed for moderate pain.  08/21/15  Yes Historical Provider, MD   Allergies  Allergen Reactions  . Vancomycin Swelling    Lip swelling   . Zosyn [Piperacillin Sod-Tazobactam So]     Lip swelling    Review of Systems  Constitutional: Positive for activity change and fatigue.  Gastrointestinal: Positive for abdominal pain.  Musculoskeletal: Positive  for back pain.  Psychiatric/Behavioral: Positive for sleep disturbance.    Physical Exam  Eyes: PERRL, lids and conjunctivae normal ENMT: Mucous membranes are dry . Posterior pharynx clear of any exudate or lesions.Normal dentition. Upper lip with swelling.  Neck: normal, supple, no masses, no thyromegaly Respiratory: clear to auscultation bilaterally, no wheezing, no crackles. Normal respiratory effort. No accessory muscle use.  Cardiovascular: Regular rate and rhythm, no murmurs / rubs / gallops. No extremity edema. 2+ pedal pulses. No carotid  bruits.  Abdomen: no tenderness, no masses palpated. No hepatosplenomegaly. Bowel sounds positive.  Musculoskeletal: no clubbing / cyanosis. No joint deformity upper and lower extremities. Good ROM, no contractures. Normal muscle tone. Bilateral LE edema worse left  Skin: no rashes, lesions, ulcers. No induration Neurologic: CN 2-12 grossly intact. Sensation intact, DTR normal. Strength 5/5 in all 4.  Psychiatric: Normal judgment and insight. Alert and oriented x 3. Normal mood.   Vital Signs: BP 104/65 mmHg  Pulse 101  Temp(Src) 99.7 F (37.6 C) (Oral)  Resp 18  Ht 5' 11"  (1.803 m)  Wt 92.2 kg (203 lb 4.2 oz)  BMI 28.36 kg/m2  SpO2 99% Pain Assessment: 0-10 POSS *See Group Information*: 1-Acceptable,Awake and alert Pain Score: 0-No pain   SpO2: SpO2: 99 % O2 Device:SpO2: 99 % O2 Flow Rate: .O2 Flow Rate (L/min): 2 L/min  IO: Intake/output summary: No intake or output data in the 24 hours ending 09/19/15 2258  LBM: Last BM Date: 09/18/15 Baseline Weight: Weight: 86.183 kg (190 lb) Most recent weight: Weight: 92.2 kg (203 lb 4.2 oz)     Palliative Assessment/Data: 30%     Time In: 1525 Time Out: 1645 Time Total: 80 Greater than 50%  of this time was spent counseling and coordinating care related to the above assessment and plan.  Signed by: Micheline Rough, MD   Please contact Palliative Medicine Team phone at [phone]  for questions and concerns.  For individual provider: See Shea Evans

## 2015-09-19 NOTE — Consult Note (Signed)
PULMONARY / CRITICAL CARE MEDICINE   Name: Martin Norton MRN: [id number] DOB: 08-May-1955    ADMISSION DATE:  09/19/2015 CONSULTATION DATE:  09/19/15  REFERRING MD:  Dr. Tomi Bamberger   CHIEF COMPLAINT:  Hypotension   HISTORY OF PRESENT ILLNESS:  60 y/o M, former smoker, with PMH of DM II, HTN, refractory diffuse large B-Cell Lymphoma treated at [facility] (see treatment hx below), failure to thrive, and weight loss who presented to [facility] on 09/19/15 with complaints of right hip / thigh pain.    The patient was diagnosed with large B-cell lymphoma in 11/2013.  From notes at [facility] >> "He was treated with R-CHOP 86/7672 with complications of neutropenia.  He was later transitioned to RICE and RR-Ibrutinib-EPOCH without significant response. He was then treated with R-GDP with a mixed response to therapy. He was then referred to radiation for consideration of therapy to the left inguinal region which he completed 11/2014. He then participated in a phase II clinical trial using Nivolumab and IDO inhibitor (Epacadostat) and had progressive disease. Most recently, he received radiation to the right pelvic mass and completed treatment in February 2017 with significant improvement in his lymphedema. 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 He was referred to [facility] for CAR-T cell therapy and is being considered for clinical trial. Therapy will likely take place this summer. We initiated therapy with Rituxan-Bendamustine in an attempt to keep his disease stable until he could undergo CAR-T cell treatment. He however progressed rapidly after 1 cycle. Therapy was changed to Brentuximab".  His course was complicated by right thigh / hip swelling with associated pain.  He suffered mechanical falls at home.  To date, he is on the wait list for CAR-T therapy for late / summer fall 2017 (however, if felt the disease is not under control, he will not be a candidate).  He has also had difficulty with decreased appetite and weight loss of  approximately 30 lbs. The last notes reflect he had been referred to Palliative Care.    On presentation to ER at [facility] via EMS he complained of R leg pain that was not relieved by prescription narcotics at home.  The patient is currently treated with fentanyl patach, oxy IR, tramadol.  He has been using up to 7 500 mg tylenol and aleve to help with pain.  The patient was noted to be hypotensive and treated with volume resuscitation.  Lactic acid returned at 4.51.  Labs otherwise (not changed from baseline) - Na 129, K 3.6, Cl 94, BUN 27, Sr Cr 0.72, glucose 72, alk phos 42, AST 31 / ALT 25, WBC 9.5, Hgb 10.2, and platelets 194.  Despite fluids, BP only improved to 09'O systolic.  Given lactic acid & hypotension, the patient was empirically treated with vancomycin / zosyn and fluid resuscitation.    PCCM called for evaluation.     PAST MEDICAL HISTORY :  He  has a past medical history of Diabetes mellitus without complication ([place]) and Cancer ([place]).  PAST SURGICAL HISTORY: He  has past surgical history that includes Lymph node biopsy.  No Known Allergies  No current facility-administered medications on file prior to encounter.   Current Outpatient Prescriptions on File Prior to Encounter  Medication Sig  . acetaminophen (TYLENOL) 500 MG tablet Take 1,000 mg by mouth every 6 (six) hours as needed for mild pain.   . Cholecalciferol (VITAMIN D3) 5000 UNITS CAPS Take 5,000 Units by mouth daily.  Marland Kitchen losartan (COZAAR) 50 MG tablet Take  50 mg by mouth daily after supper.   . metFORMIN (GLUCOPHAGE) 500 MG tablet Take 500 mg by mouth 2 (two) times daily with a meal.    FAMILY HISTORY:  His has no family status information on file.   SOCIAL HISTORY: He  reports that he has quit smoking. His smoking use included Cigarettes. He has a 12.5 pack-year smoking history. He has never used smokeless tobacco. He reports that he drinks about 1.8 oz of alcohol per week. He reports that he does not use illicit  drugs.  REVIEW OF SYSTEMS:  POSITIVES IN BOLD Gen: Denies fever, chills, weight change, fatigue, night sweats HEENT: Denies blurred vision, double vision, hearing loss, tinnitus, sinus congestion, rhinorrhea, sore throat, neck stiffness, dysphagia PULM: Denies shortness of breath, cough, sputum production, hemoptysis, wheezing CV: Denies chest pain, edema, orthopnea, paroxysmal nocturnal dyspnea, palpitations GI: Denies abdominal pain, nausea, vomiting, diarrhea, hematochezia, melena, constipation, change in bowel habits GU: Denies dysuria, hematuria, polyuria, oliguria, urethral discharge Endocrine: Denies hot or cold intolerance, polyuria, polyphagia or appetite change Derm: Denies rash, dry skin, scaling or peeling skin change Heme: Denies easy bruising, bleeding, bleeding gums Neuro: Denies headache, numbness, weakness, slurred speech, loss of memory or consciousness MSK:  Right hip / thigh pain   SUBJECTIVE:  Pt reports pain in right hip & thigh that has not been controlled by home regimen.    VITAL SIGNS: BP 88/55 mmHg  Pulse 120  Temp(Src) 97.7 F (36.5 C) (Oral)  Resp 15  Wt 190 lb (86.183 kg)  SpO2 98%  HEMODYNAMICS:    VENTILATOR SETTINGS:    INTAKE / OUTPUT:    PHYSICAL EXAMINATION: General:  Chronically ill appearing adult male, appears uncomfortable Neuro:  AAOx4, speech clear, MAE  HEENT:  MM pink/dry, no jvd Cardiovascular:  s1s2 rrr Lungs:  Even/non-labored, lungs bilaterally clear  Abdomen:  Obese/soft, bsx4 active  Musculoskeletal:  No acute deformities, BLE 3+ pitting edema  Skin:  Warm/dry  LABS:  BMET  Recent Labs Lab 09/19/15 0416  NA 129*  K 3.6  CL 94*  CO2 23  BUN 27*  CREATININE 0.72  GLUCOSE 72    Electrolytes  Recent Labs Lab 09/19/15 0416  CALCIUM 9.9    CBC  Recent Labs Lab 09/19/15 0416  WBC 9.5  HGB 10.2*  HCT 29.4*  PLT 194    Coag's No results for input(s): APTT, INR in the last 168 hours.  Sepsis  Markers  Recent Labs Lab 09/19/15 0847  LATICACIDVEN 4.51*    ABG No results for input(s): PHART, PCO2ART, PO2ART in the last 168 hours.  Liver Enzymes  Recent Labs Lab 09/19/15 0422  AST 31  ALT 25  ALKPHOS 42  BILITOT 0.6  ALBUMIN 3.6    Cardiac Enzymes No results for input(s): TROPONINI, PROBNP in the last 168 hours.  Glucose No results for input(s): GLUCAP in the last 168 hours.  Imaging Dg Chest Port 1 View  09/19/2015  CLINICAL DATA:  Non-Hodgkin's lymphoma. EXAM: PORTABLE CHEST 1 VIEW COMPARISON:  None. FINDINGS: There is pleural thickening along the lateral upper right hemithorax, measuring 6.1 x 2.3 cm. There is no edema or consolidation. The heart is borderline enlarged with pulmonary vascularity within normal limits. Atherosclerotic calcification is noted in the aorta. No adenopathy is evident. There are no blastic or lytic bone lesions evident. Port-A-Cath tip is in the superior vena cava. No evident pneumothorax. There is calcification in the left carotid artery. IMPRESSION: Pleural thickening on the right laterally.  Chronicity of this finding uncertain. Given history of lymphoma and this pleural thickening, correlation with contrast enhanced chest CT may be of value to further assess. No edema or consolidation. Heart borderline enlarged. There is aortic atherosclerosis. There is also left carotid artery calcification. No adenopathy is demonstrable by radiography. Electronically Signed   By: Lowella Grip III M.D.   On: 09/19/2015 09:30     STUDIES:  CT Chest / ABD / Pelvis 07/24/15 [facility]) >> new RUL lesion abutting the R major fissure 2x2 cm, GGO RUL, subpleural nodules unchanged, increase in size of retroperitoneal nodes / masses, lower right retroperitoneal mass encasing the IVC, R pelvic sidewall mass increased, bladder wall thickening   CULTURES: BCx2 7/6 >>  UA 7/6 >>  UC 7/6 >>   ANTIBIOTICS: Vancomycin 7/6 >>  Zosyn 7/6 >>   SIGNIFICANT  EVENTS: 7/06  Admit to [facility] with complaints of right hip / thigh pain  DISCUSSION: 60 y/o M with PMH of DM II, HTN, and diffuse B-cell lymphoma (Dx in 11/2013, recently given approx 3 months to live per ONC) who presented to [facility] 7/6 with complaints of significant R hip / thigh pain, unresponsive to home narcotics.   ASSESSMENT / PLAN:  1.  Refractory diffuse B-Cell Lymphoma - followed at [facility], 07/2015 CT abd / pelvis shows increased size of right retroperitoneal mass that encases the IVC as well as pelvic wall mass (in addition to other areas).   Recommend hold off on further imaging given goals of care.  If patient stabilizes and wishes to proceed, consider CT of abd / pelvis in am if he wants to pursue further imaging. ? If pt would be a candidate at this point for further palliative radiation.  2.  Refractory Pain - in setting of refractory diffuse lymphoma.  Patients primary goal is pain control at this point.  He has been on home fentanyl 8mg patch, oxy IR 5 mg, and tramadol. Have requested palliative care for an urgent pain control consult and transition to possible facility care for EOL when appropriate.  He has limited family support as an outpatient. Discussed pain control with Palliative Care MD, will initiate fentanyl PCA with basal rate.  Additional IVF for hydration to aide in pain control. Continue home decadron but will increase to 495mBID.    3.  Hypotension / Elevated Lactate - no occult evidence of sepsis on presentation.  However, patient accepting of medical therapies to review for infection (blood / urine cultures, empiric abx).  He understands that to achieve adequate pain control that his blood pressure may be affected.    4.  Goals of Care - extensive discussion with patient and girlfriend regarding his overall state of health, what his understanding of his cancer is as relayed to him by his primary ONC, his primary goals of care (which he currently states to be pain control), he  is accepting of medical therapies that would aide in comfort.  DNR/DNI/no pressors.      FAMILY  - Updates: Patient and Girlfriend (pt's caretaker) updated at bedside.   - Inter-disciplinary family meet or Palliative Care meeting due by:  Ongoing   BrNoe GensNP-C [facility] Pgr: [phone] or if no answer [phone]/08/2015, 12:26 PM   CC:  Dr. ZaLeonie Man

## 2015-09-19 NOTE — H&P (Signed)
History and Physical    Martin Norton [id number] DOB: 1955/07/17 DOA: 09/19/2015  PCP: Red Christians, MD  Patient coming from; Home   Chief Complaint: legs pain.   HPI: Martin Norton is a 60 y.o. 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 He report his pain is severe, pain medications not helping at home. He denies cough, chest pain or diarrhea.   ED Course: he was found to have lactic acidosis, no clear source for infection, he became hypotensive in the ED SBP 60---80. CCM consulted. CCM discussed with patient and fiance, patient is DNR, no vasopressors. Pain management. He agree with IV fluids, IV antibiotics.   Patient also notice swelling of his upper lips, this develop here in the ED. No other symptoms,no rash, no difficulty swallowing or tongue swelling.    Review of Systems: As per HPI otherwise 10 point review of systems negative.    Past Medical History  Diagnosis Date  . Diabetes mellitus without complication ([place])   . Cancer [facility])     Past Surgical History  Procedure Laterality Date  . Lymph node biopsy       reports that he has quit smoking. His smoking use included Cigarettes. He has a 12.5 pack-year smoking history. He has never used smokeless tobacco. He reports that he drinks about 1.8 oz of alcohol per week. He reports that he does not use illicit drugs.  Allergies  Allergen Reactions  . Vancomycin Swelling    Lip swelling   . Zosyn [Piperacillin Sod-Tazobactam So]     Lip swelling     Family History  Problem Relation Age of Onset  . Lymphoma Mother   . Lung cancer Father   . Throat cancer Brother      Prior to Admission medications   Medication Sig Start Date End Date Taking? Authorizing Provider  acetaminophen  (TYLENOL) 500 MG tablet Take 1,000 mg by mouth every 6 (six) hours as needed for mild pain.    Yes Historical Provider, MD  Cholecalciferol (VITAMIN D3) 5000 UNITS CAPS Take 5,000 Units by mouth daily.   Yes Historical Provider, MD  dexamethasone (DECADRON) 1 MG tablet Take 2 mg by mouth 2 (two) times daily with a meal.  08/19/15  Yes Historical Provider, MD  fentaNYL (DURAGESIC) 25 MCG/HR patch Place 25 mcg onto the skin every 3 (three) days. 09/17/15  Yes Historical Provider, MD  losartan (COZAAR) 50 MG tablet Take 50 mg by mouth daily after supper.  06/20/14  Yes Historical Provider, MD  metFORMIN (GLUCOPHAGE) 500 MG tablet Take 500 mg by mouth 2 (two) times daily with a meal. 06/20/14  Yes Historical Provider, MD  oxyCODONE (OXY IR/ROXICODONE) 5 MG immediate release tablet Take 5 mg by mouth every 4 (four) hours as needed for moderate pain.    Yes Historical Provider, MD  traMADol (ULTRAM) 50 MG tablet Take 50 mg by mouth every 6 (six) hours as needed for moderate pain.  08/21/15  Yes Historical Provider, MD    Physical Exam: Filed Vitals:   09/19/15 1215 09/19/15 1300 09/19/15 1336 09/19/15 1400  BP: 64/22 66/38 87/61  101/42  Pulse: 64 64 114 117  Temp:      TempSrc:      Resp: 20 17 12 20   Weight:  SpO2: 97% 100% 94% 93%      Constitutional: NAD, calm, comfortable Filed Vitals:   09/19/15 1215 09/19/15 1300 09/19/15 1336 09/19/15 1400  BP: 64/22 66/38 87/61  101/42  Pulse: 64 64 114 117  Temp:      TempSrc:      Resp: 20 17 12 20   Weight:      SpO2: 97% 100% 94% 93%   Eyes: PERRL, lids and conjunctivae normal ENMT: Mucous membranes are dry . Posterior pharynx clear of any exudate or lesions.Normal dentition. Upper lip with swelling.  Neck: normal, supple, no masses, no thyromegaly Respiratory: clear to auscultation bilaterally, no wheezing, no crackles. Normal respiratory effort. No accessory muscle use.  Cardiovascular: Regular rate and rhythm, no murmurs / rubs / gallops. No  extremity edema. 2+ pedal pulses. No carotid bruits.  Abdomen: no tenderness, no masses palpated. No hepatosplenomegaly. Bowel sounds positive.  Musculoskeletal: no clubbing / cyanosis. No joint deformity upper and lower extremities. Good ROM, no contractures. Normal muscle tone. Bilateral LE edema worse left  Skin: no rashes, lesions, ulcers. No induration Neurologic: CN 2-12 grossly intact. Sensation intact, DTR normal. Strength 5/5 in all 4.  Psychiatric: Normal judgment and insight. Alert and oriented x 3. Normal mood.     Labs on Admission: I have personally reviewed following labs and imaging studies  CBC:  Recent Labs Lab 09/19/15 0416  WBC 9.5  NEUTROABS 9.2*  HGB 10.2*  HCT 29.4*  MCV 88.6  PLT [id number]   Basic Metabolic Panel:  Recent Labs Lab 09/19/15 0416  NA 129*  K 3.6  CL 94*  CO2 23  GLUCOSE 72  BUN 27*  CREATININE 0.72  CALCIUM 9.9   GFR: CrCl cannot be calculated (Unknown ideal weight.). Liver Function Tests:  Recent Labs Lab 09/19/15 0422  AST 31  ALT 25  ALKPHOS 42  BILITOT 0.6  PROT 6.2*  ALBUMIN 3.6   No results for input(s): LIPASE, AMYLASE in the last 168 hours. No results for input(s): AMMONIA in the last 168 hours. Coagulation Profile: No results for input(s): INR, PROTIME in the last 168 hours. Cardiac Enzymes: No results for input(s): CKTOTAL, CKMB, CKMBINDEX, TROPONINI in the last 168 hours. BNP (last 3 results) No results for input(s): PROBNP in the last 8760 hours. HbA1C: No results for input(s): HGBA1C in the last 72 hours. CBG: No results for input(s): GLUCAP in the last 168 hours. Lipid Profile: No results for input(s): CHOL, HDL, LDLCALC, TRIG, CHOLHDL, LDLDIRECT in the last 72 hours. Thyroid Function Tests: No results for input(s): TSH, T4TOTAL, FREET4, T3FREE, THYROIDAB in the last 72 hours. Anemia Panel: No results for input(s): VITAMINB12, FOLATE, FERRITIN, TIBC, IRON, RETICCTPCT in the last 72 hours. Urine  analysis:    Component Value Date/Time   COLORURINE YELLOW 09/19/2015 [place] 09/19/2015 1156   LABSPEC 1.020 09/19/2015 1156   PHURINE 6.0 09/19/2015 1156   GLUCOSEU NEGATIVE 09/19/2015 1156   HGBUR NEGATIVE 09/19/2015 [place] 09/19/2015 1156   KETONESUR NEGATIVE 09/19/2015 1156   PROTEINUR NEGATIVE 09/19/2015 1156   UROBILINOGEN 0.2 07/08/2014 1624   NITRITE NEGATIVE 09/19/2015 1156   LEUKOCYTESUR NEGATIVE 09/19/2015 1156   Sepsis Labs: !!!!!!!!!!!!!!!!!!!!!!!!!!!!!!!!!!!!!!!!!!!! @LABRCNTIP (procalcitonin:4,lacticidven:4) )No results found for this or any previous visit (from the past 240 hour(s)).   Radiological Exams on Admission: Dg Chest Port 1 View  09/19/2015  CLINICAL DATA:  Non-Hodgkin's lymphoma. EXAM: PORTABLE CHEST 1 VIEW COMPARISON:  None. FINDINGS: There is pleural thickening along the  lateral upper right hemithorax, measuring 6.1 x 2.3 cm. There is no edema or consolidation. The heart is borderline enlarged with pulmonary vascularity within normal limits. Atherosclerotic calcification is noted in the aorta. No adenopathy is evident. There are no blastic or lytic bone lesions evident. Port-A-Cath tip is in the superior vena cava. No evident pneumothorax. There is calcification in the left carotid artery. IMPRESSION: Pleural thickening on the right laterally. Chronicity of this finding uncertain. Given history of lymphoma and this pleural thickening, correlation with contrast enhanced chest CT may be of value to further assess. No edema or consolidation. Heart borderline enlarged. There is aortic atherosclerosis. There is also left carotid artery calcification. No adenopathy is demonstrable by radiography. Electronically Signed   By: Lowella Grip III M.D.   On: 09/19/2015 09:30    EKG: Independently reviewed.   Assessment/Plan Active Problems:   Right thigh pain   Diffuse large B-cell lymphoma (HCC)   Arterial hypotension    Lactic acid acidosis  1-Hypotension;  Suspect Hypovolemia, but due to elevated lactic acid will cover for infection .  Develops lips swelling , For this reason will change vanc and Zosyn to Levaquin and aztreonam. Zyvox if it is ok with CCM/  IV bolus. IV decadron will help in event of adrenal insufficiency.  Patient discussed with CCM no IV pressors.   2-Lactic acidosis.  Peak to 4,trending down.  Continue with IV fluids.  Less likely sepsis per CCM/  Continue with IV antibiotics for now.   3-Diffuse large Cell lymphoma;  Has had extensive treatment at [facility].  To date, he is on the wait list for CAR-T therapy for late / summer fall 2017 (however, if felt the disease is not under control, he will not be a candidate).  Palliative consulted for pain management and further goals of care.  Per brandy patient oncologist recomended increase home dose decadron, this will help with pain.   4-Hyponatremia; Suspect related to hypovolemia; IV fluids.  5-Lower extremity edema; check doppler rule out DVT.    DVT prophylaxis: lovenox.  Code Status: DNR  Family Communication: fiance at bedside.  Disposition Plan: to be determine.  Consults called: CCM, palliative care  Admission status: impatient ,med-surgery per CCM    Elmarie Shiley MD [facility] Pager [phone]  If 7PM-7AM, please contact night-coverage [email or web address] Password [email or web address]  09/19/2015, 2:08 PM

## 2015-09-19 NOTE — ED Notes (Signed)
[name]: [phone]

## 2015-09-19 NOTE — Progress Notes (Addendum)
Pharmacy Antibiotic Follow-up Note  Martin Norton is a 60 y.o. year-old male admitted on 09/19/2015.  The patient is currently on day 1 of Vancomycin & Zosyn for probable sepsis. Swelling of upper lip noted in ED, attributed to abx, unable to distinguish if Vancomycin or Zosyn.  Abx changed to Linezolid, Aztreonam, Levaquin for treatment of sepsis.  Assessment/Plan: Linezolid 600mg  q12 Aztreonam 2gm x1, then 1gm q8 Levaquin 750mg  IV q24  Temp (24hrs), Avg:98.4 F (36.9 C), Min:98.4 F (36.9 C), Max:98.4 F (36.9 C)   Recent Labs Lab 09/19/15 0416  WBC 9.5    Recent Labs Lab 09/19/15 0416  CREATININE 0.72   CrCl cannot be calculated (Unknown ideal weight.).    No Known Allergies  Antimicrobials this admission: 7/6 Vancomcyin >> 7/6 7/6 Zosyn >> 7/6 7/6 Linezolid >> 7/6 Aztreonam >> 7/6 Levofloxacin >>  Levels/dose changes this admission:  Microbiology results: 7/6 BCx: collected 7/6 UCx: collected  Thank you for allowing pharmacy to be a part of this patient's care.  Minda Ditto PharmD Pager [phone] 09/19/2015, 2:47 PM

## 2015-09-20 ENCOUNTER — Observation Stay (HOSPITAL_COMMUNITY): Payer: BLUE CROSS/BLUE SHIELD

## 2015-09-20 ENCOUNTER — Observation Stay (HOSPITAL_BASED_OUTPATIENT_CLINIC_OR_DEPARTMENT_OTHER): Payer: BLUE CROSS/BLUE SHIELD

## 2015-09-20 DIAGNOSIS — R509 Fever, unspecified: Secondary | ICD-10-CM | POA: Diagnosis not present

## 2015-09-20 DIAGNOSIS — T80219D Unspecified infection due to central venous catheter, subsequent encounter: Secondary | ICD-10-CM | POA: Diagnosis not present

## 2015-09-20 DIAGNOSIS — Z7189 Other specified counseling: Secondary | ICD-10-CM

## 2015-09-20 DIAGNOSIS — Z807 Family history of other malignant neoplasms of lymphoid, hematopoietic and related tissues: Secondary | ICD-10-CM | POA: Diagnosis not present

## 2015-09-20 DIAGNOSIS — Z7984 Long term (current) use of oral hypoglycemic drugs: Secondary | ICD-10-CM | POA: Diagnosis not present

## 2015-09-20 DIAGNOSIS — E861 Hypovolemia: Secondary | ICD-10-CM | POA: Diagnosis present

## 2015-09-20 DIAGNOSIS — J90 Pleural effusion, not elsewhere classified: Secondary | ICD-10-CM | POA: Diagnosis present

## 2015-09-20 DIAGNOSIS — Z881 Allergy status to other antibiotic agents status: Secondary | ICD-10-CM | POA: Diagnosis not present

## 2015-09-20 DIAGNOSIS — A419 Sepsis, unspecified organism: Secondary | ICD-10-CM | POA: Diagnosis present

## 2015-09-20 DIAGNOSIS — R222 Localized swelling, mass and lump, trunk: Secondary | ICD-10-CM | POA: Diagnosis not present

## 2015-09-20 DIAGNOSIS — Z808 Family history of malignant neoplasm of other organs or systems: Secondary | ICD-10-CM | POA: Diagnosis not present

## 2015-09-20 DIAGNOSIS — I9589 Other hypotension: Secondary | ICD-10-CM | POA: Diagnosis not present

## 2015-09-20 DIAGNOSIS — B999 Unspecified infectious disease: Secondary | ICD-10-CM | POA: Diagnosis present

## 2015-09-20 DIAGNOSIS — R609 Edema, unspecified: Secondary | ICD-10-CM

## 2015-09-20 DIAGNOSIS — Z801 Family history of malignant neoplasm of trachea, bronchus and lung: Secondary | ICD-10-CM | POA: Diagnosis not present

## 2015-09-20 DIAGNOSIS — Z79899 Other long term (current) drug therapy: Secondary | ICD-10-CM | POA: Diagnosis not present

## 2015-09-20 DIAGNOSIS — R931 Abnormal findings on diagnostic imaging of heart and coronary circulation: Secondary | ICD-10-CM | POA: Diagnosis not present

## 2015-09-20 DIAGNOSIS — A4901 Methicillin susceptible Staphylococcus aureus infection, unspecified site: Secondary | ICD-10-CM | POA: Diagnosis not present

## 2015-09-20 DIAGNOSIS — E119 Type 2 diabetes mellitus without complications: Secondary | ICD-10-CM | POA: Diagnosis present

## 2015-09-20 DIAGNOSIS — R7881 Bacteremia: Secondary | ICD-10-CM | POA: Diagnosis not present

## 2015-09-20 DIAGNOSIS — I10 Essential (primary) hypertension: Secondary | ICD-10-CM | POA: Diagnosis not present

## 2015-09-20 DIAGNOSIS — X58XXXA Exposure to other specified factors, initial encounter: Secondary | ICD-10-CM | POA: Diagnosis present

## 2015-09-20 DIAGNOSIS — I110 Hypertensive heart disease with heart failure: Secondary | ICD-10-CM | POA: Diagnosis present

## 2015-09-20 DIAGNOSIS — M79604 Pain in right leg: Secondary | ICD-10-CM | POA: Diagnosis not present

## 2015-09-20 DIAGNOSIS — A4101 Sepsis due to Methicillin susceptible Staphylococcus aureus: Secondary | ICD-10-CM | POA: Diagnosis present

## 2015-09-20 DIAGNOSIS — Z79891 Long term (current) use of opiate analgesic: Secondary | ICD-10-CM | POA: Diagnosis not present

## 2015-09-20 DIAGNOSIS — T80211A Bloodstream infection due to central venous catheter, initial encounter: Secondary | ICD-10-CM | POA: Diagnosis present

## 2015-09-20 DIAGNOSIS — C8330 Diffuse large B-cell lymphoma, unspecified site: Secondary | ICD-10-CM | POA: Diagnosis present

## 2015-09-20 DIAGNOSIS — M79606 Pain in leg, unspecified: Secondary | ICD-10-CM | POA: Diagnosis present

## 2015-09-20 DIAGNOSIS — M25551 Pain in right hip: Secondary | ICD-10-CM | POA: Diagnosis present

## 2015-09-20 DIAGNOSIS — I358 Other nonrheumatic aortic valve disorders: Secondary | ICD-10-CM | POA: Diagnosis present

## 2015-09-20 DIAGNOSIS — E872 Acidosis: Secondary | ICD-10-CM | POA: Diagnosis present

## 2015-09-20 DIAGNOSIS — I340 Nonrheumatic mitral (valve) insufficiency: Secondary | ICD-10-CM | POA: Diagnosis not present

## 2015-09-20 DIAGNOSIS — T80219S Unspecified infection due to central venous catheter, sequela: Secondary | ICD-10-CM | POA: Diagnosis not present

## 2015-09-20 DIAGNOSIS — S90822A Blister (nonthermal), left foot, initial encounter: Secondary | ICD-10-CM | POA: Diagnosis present

## 2015-09-20 DIAGNOSIS — Z95828 Presence of other vascular implants and grafts: Secondary | ICD-10-CM

## 2015-09-20 DIAGNOSIS — I890 Lymphedema, not elsewhere classified: Secondary | ICD-10-CM | POA: Diagnosis present

## 2015-09-20 DIAGNOSIS — E871 Hypo-osmolality and hyponatremia: Secondary | ICD-10-CM | POA: Diagnosis present

## 2015-09-20 DIAGNOSIS — B9561 Methicillin susceptible Staphylococcus aureus infection as the cause of diseases classified elsewhere: Secondary | ICD-10-CM | POA: Diagnosis not present

## 2015-09-20 DIAGNOSIS — R634 Abnormal weight loss: Secondary | ICD-10-CM | POA: Diagnosis present

## 2015-09-20 DIAGNOSIS — G893 Neoplasm related pain (acute) (chronic): Secondary | ICD-10-CM | POA: Diagnosis present

## 2015-09-20 DIAGNOSIS — R6521 Severe sepsis with septic shock: Secondary | ICD-10-CM

## 2015-09-20 DIAGNOSIS — F05 Delirium due to known physiological condition: Secondary | ICD-10-CM | POA: Diagnosis not present

## 2015-09-20 DIAGNOSIS — Z515 Encounter for palliative care: Secondary | ICD-10-CM | POA: Diagnosis present

## 2015-09-20 DIAGNOSIS — Z880 Allergy status to penicillin: Secondary | ICD-10-CM | POA: Diagnosis not present

## 2015-09-20 DIAGNOSIS — Y718 Miscellaneous cardiovascular devices associated with adverse incidents, not elsewhere classified: Secondary | ICD-10-CM | POA: Diagnosis not present

## 2015-09-20 DIAGNOSIS — I5020 Unspecified systolic (congestive) heart failure: Secondary | ICD-10-CM | POA: Diagnosis present

## 2015-09-20 DIAGNOSIS — R627 Adult failure to thrive: Secondary | ICD-10-CM | POA: Diagnosis present

## 2015-09-20 DIAGNOSIS — T80219A Unspecified infection due to central venous catheter, initial encounter: Secondary | ICD-10-CM | POA: Diagnosis present

## 2015-09-20 DIAGNOSIS — Z87891 Personal history of nicotine dependence: Secondary | ICD-10-CM | POA: Diagnosis not present

## 2015-09-20 DIAGNOSIS — R600 Localized edema: Secondary | ICD-10-CM | POA: Diagnosis not present

## 2015-09-20 DIAGNOSIS — Z66 Do not resuscitate: Secondary | ICD-10-CM | POA: Diagnosis present

## 2015-09-20 LAB — BASIC METABOLIC PANEL
Anion gap: 8 (ref 5–15)
BUN: 32 mg/dL — ABNORMAL HIGH (ref 6–20)
CALCIUM: 8.4 mg/dL — AB (ref 8.9–10.3)
CO2: 21 mmol/L — ABNORMAL LOW (ref 22–32)
Chloride: 101 mmol/L (ref 101–111)
Creatinine, Ser: 1.07 mg/dL (ref 0.61–1.24)
Glucose, Bld: 128 mg/dL — ABNORMAL HIGH (ref 65–99)
POTASSIUM: 4.6 mmol/L (ref 3.5–5.1)
SODIUM: 130 mmol/L — AB (ref 135–145)

## 2015-09-20 LAB — CBC
HEMATOCRIT: 26.5 % — AB (ref 39.0–52.0)
HEMOGLOBIN: 9.2 g/dL — AB (ref 13.0–17.0)
MCH: 31.1 pg (ref 26.0–34.0)
MCHC: 34.7 g/dL (ref 30.0–36.0)
MCV: 89.5 fL (ref 78.0–100.0)
Platelets: 135 10*3/uL — ABNORMAL LOW (ref 150–400)
RBC: 2.96 MIL/uL — AB (ref 4.22–5.81)
RDW: 15.9 % — ABNORMAL HIGH (ref 11.5–15.5)
WBC: 11.3 10*3/uL — AB (ref 4.0–10.5)

## 2015-09-20 LAB — ECHOCARDIOGRAM COMPLETE
HEIGHTINCHES: 71 in
WEIGHTICAEL: 3252.23 [oz_av]

## 2015-09-20 LAB — URINE CULTURE: Culture: NO GROWTH

## 2015-09-20 MED ORDER — GADOBENATE DIMEGLUMINE 529 MG/ML IV SOLN
20.0000 mL | Freq: Once | INTRAVENOUS | Status: AC | PRN
  Administered 2015-09-20: 18 mL via INTRAVENOUS

## 2015-09-20 MED ORDER — VANCOMYCIN HCL 10 G IV SOLR
1250.0000 mg | Freq: Two times a day (BID) | INTRAVENOUS | Status: DC
  Administered 2015-09-20 – 2015-09-24 (×9): 1250 mg via INTRAVENOUS
  Filled 2015-09-20 (×10): qty 1250

## 2015-09-20 MED ORDER — HEPARIN SODIUM (PORCINE) 5000 UNIT/ML IJ SOLN
5000.0000 [IU] | Freq: Three times a day (TID) | INTRAMUSCULAR | Status: DC
  Administered 2015-09-21 – 2015-09-27 (×16): 5000 [IU] via SUBCUTANEOUS
  Filled 2015-09-20 (×16): qty 1

## 2015-09-20 NOTE — Progress Notes (Addendum)
*  Preliminary Results* Bilateral lower extremity venous duplex completed. Bilateral lower extremities are negative for deep vein thrombosis. There is no evidence of Baker's cyst bilaterally.  Incidental finding: there is a heterogenous area of the left groin measuring 3.8cm, suggestive of possible enlarged inguinal lymph node, with adjacent heterogenous mass with low-resistant internal vascularity; etiology unknown.  Preliminary results discussed with Dr. Wendee Beavers.  09/20/2015  Maudry Mayhew, RVT, RDCS, RDMS

## 2015-09-20 NOTE — Anesthesia Preprocedure Evaluation (Addendum)
Anesthesia Evaluation  Patient identified by MRN, date of birth, ID band Patient awake    Reviewed: Allergy & Precautions, NPO status , Patient's Chart, lab work & pertinent test results  Airway Mallampati: II  TM Distance: >3 FB Neck ROM: Full    Dental no notable dental hx.    Pulmonary neg pulmonary ROS, former smoker,    Pulmonary exam normal breath sounds clear to auscultation       Cardiovascular negative cardio ROS Normal cardiovascular exam Rhythm:Regular Rate:Normal     Neuro/Psych negative neurological ROS  negative psych ROS   GI/Hepatic negative GI ROS, Neg liver ROS,   Endo/Other  negative endocrine ROSdiabetes  Renal/GU negative Renal ROS     Musculoskeletal negative musculoskeletal ROS (+)   Abdominal   Peds  Hematology negative hematology ROS (+)   Anesthesia Other Findings   Reproductive/Obstetrics negative OB ROS                            Anesthesia Physical Anesthesia Plan  ASA: III  Anesthesia Plan: MAC   Post-op Pain Management:    Induction: Intravenous  Airway Management Planned:   Additional Equipment:   Intra-op Plan:   Post-operative Plan:   Informed Consent: I have reviewed the patients History and Physical, chart, labs and discussed the procedure including the risks, benefits and alternatives for the proposed anesthesia with the patient or authorized representative who has indicated his/her understanding and acceptance.   Dental advisory given  Plan Discussed with: CRNA  Anesthesia Plan Comments:        Anesthesia Quick Evaluation

## 2015-09-20 NOTE — Progress Notes (Signed)
Pharmacy Antibiotic Follow-up Note  Martin Norton is a 60 y.o. year-old male admitted on 09/19/2015.  One dose of Vancomycin & Zosyn given in ED on 7/6, for probable sepsis. Swelling of upper lip noted by girlfriend, attributed to abx, unable to distinguish if Vancomycin or Zosyn.  Abx changed to Linezolid, Aztreonam, Levaquin for treatment of sepsis. No cephalosporin administration found per review in Epic.  ID consulted 7/7, per discussion with Dr. Tommy Medal, ok to change Zyvox to Vancomycin as symptoms more consistent with PCN allergy than vancomycin and patient did not notice them himself.  Assessment/Plan:  Vancomycin 1250mg  IV q12h Check trough at steady state, goal = 15-20 mcg/ml Follow up renal function & repeat blood cultures  Temp (24hrs), Avg:98.9 F (37.2 C), Min:98.4 F (36.9 C), Max:99.7 F (37.6 C)   Recent Labs Lab 09/19/15 0416 09/20/15 0625  WBC 9.5 11.3*     Recent Labs Lab 09/19/15 0416 09/20/15 0625  CREATININE 0.72 1.07   Estimated Creatinine Clearance: 84.2 mL/min (by C-G formula based on Cr of 1.07).    Allergies  Allergen Reactions  . Zosyn [Piperacillin Sod-Tazobactam So]     Lip swelling     Antimicrobials this admission: 7/6 Vanc x 1 7/6 Zosyn x 1 7/6 Levoflox x 1 7/6 Aztreo x 1 7/6 Zyvox >> 7/7 7/7 Vanc >>  Levels/dose changes this admission:  Microbiology results: 7/6 BCx: MSSA per BCID 7/6 UCx: sent 7/7 BCx: 7/7 HIV Ab: 7/7 Hep C Ab:  Thank you for allowing pharmacy to be a part of this patient's care.  Peggyann Juba, PharmD, BCPS Pager: [phone]  09/20/2015, 9:40 AM

## 2015-09-20 NOTE — Progress Notes (Signed)
Daily Progress Note   Patient Name: Martin Norton       Date: 09/20/2015 DOB: Dec 13, 1955  Age: 60 y.o. MRN#: [id number] Attending Physician: Velvet Bathe, MD Primary Care Physician: Red Christians, MD Admit Date: 09/19/2015  Reason for Consultation/Follow-up: Establishing goals of care and Pain control  Subjective: I met today with Mr. Boettner, his wife, and his brother.    We reviewed that his family (brother and girlfriend) are this most important thing to him.  He reports that he also wants to live as well as possible as long as possible but is very clear that he understands that he is approaching end of life due to his cancer.  He would like to continue treatment and workup of current acute infection, however, he is clear that ensuring his comfort is the most important thing to him moving forward in the event of decompensation.  Currently reports that his pain is well controlled.  Rates 0/10 today.  Last BM yesterday and agreeable to starting daily senna as escalation of bowel regimen in light of increased fentanyl dose.   Length of Stay:   Current Medications: Scheduled Meds:  . dexamethasone  8 mg Intravenous Q6H  . famotidine (PEPCID) IV  20 mg Intravenous Q12H  . fentaNYL  50 mcg Transdermal Q72H  . fentaNYL   Intravenous Q4H  . [START ON 09/21/2015] heparin subcutaneous  5,000 Units Subcutaneous Q8H  . vancomycin  1,250 mg Intravenous Q12H    Continuous Infusions: . sodium chloride 100 mL/hr at 09/19/15 2222    PRN Meds: acetaminophen **OR** acetaminophen, diphenhydrAMINE **OR** diphenhydrAMINE, ondansetron (ZOFRAN) IV, ondansetron **OR** ondansetron (ZOFRAN) IV  Physical Exam         Eyes: PERRL, lids and conjunctivae normal ENMT: Mucous membranes are dry . Posterior  pharynx clear of any exudate or lesions.Normal dentition. Upper lip with swelling.  Neck: normal, supple, no masses, no thyromegaly Respiratory: clear to auscultation bilaterally, no wheezing, no crackles. Normal respiratory effort. No accessory muscle use.  Cardiovascular: Regular rate and rhythm, no murmurs / rubs / gallops.No carotid bruits.  Abdomen: no tenderness, no masses palpated. No hepatosplenomegaly. Bowel sounds positive.  Musculoskeletal: no clubbing / cyanosis.  Bilateral LE edema worse left  Skin: no rashes, lesions, ulcers. No induration Psychiatric: Normal judgment and insight. Alert and conversational.  Vital  Signs: BP 125/85 mmHg  Pulse 86  Temp(Src) 97.6 F (36.4 C) (Oral)  Resp 16  Ht 5' 11"  (1.803 m)  Wt 92.2 kg (203 lb 4.2 oz)  BMI 28.36 kg/m2  SpO2 99% SpO2: SpO2: 99 % O2 Device: O2 Device: Not Delivered O2 Flow Rate: O2 Flow Rate (L/min): 2 L/min  Intake/output summary:  Intake/Output Summary (Last 24 hours) at 09/20/15 1931 Last data filed at 09/20/15 1759  Gross per 24 hour  Intake    600 ml  Output   1475 ml  Net   -875 ml   LBM: Last BM Date: 09/18/15 Baseline Weight: Weight: 86.183 kg (190 lb) Most recent weight: Weight: 92.2 kg (203 lb 4.2 oz)       Palliative Assessment/Data: 30%     Patient Active Problem List   Diagnosis Date Noted  . Staphylococcus aureus bacteremia with sepsis ([place])   . Septic shock ([place])   . Leg pain, right   . Pleural effusion   . Infected venous access port   . Hypotension 09/19/2015  . Right thigh pain   . Diffuse large B-cell lymphoma ([place])   . Cancer associated pain   . Arterial hypotension   . Lactic acid acidosis     Palliative Care Assessment & Plan   Recommendations/Plan:  - Goal remains to treat treatable conditions while understanding that he has progressing deterioration from cancer.  He would like to continue antibiotics, follow recs of ID (remove port, MRI thigh) but is clear that he  does not want to escalate care (No pressors, intubation, CPR) in event of decompensation. - Pain: Improved.  He has not used PCA to this point.  I discussed with bedside nurse and pharmacy.  Would continue for now, especially in light of upcoming procedure to remove port a cath, but when current fentanyl syringe expires, would d/c PCA in favor of intermittent pushes if his need remain consistently low.  Current syringe due to expire 7/15.  Continue fentanyl patch at 53mg/hr.  I think that his pain is better controlled primarily due to escalation of steroids.  Will touch base with primary to discuss timing for tapering of steroids for pain control in light of MSSA infection. - Constipation: opioid related.  Senna daily.  Code Status:    Code Status Orders        Start     Ordered   09/19/15 1609  Do not attempt resuscitation (DNR)   Continuous    Question Answer Comment  In the event of cardiac or respiratory ARREST Do not call a "code blue"   In the event of cardiac or respiratory ARREST Do not perform Intubation, CPR, defibrillation or ACLS   In the event of cardiac or respiratory ARREST Use medication by any route, position, wound care, and other measures to relive pain and suffering. May use oxygen, suction and manual treatment of airway obstruction as needed for comfort.      09/19/15 1608    Code Status History    Date Active Date Inactive Code Status Order ID Comments User Context   09/19/2015 12:37 PM 09/19/2015  4:08 PM DNR [id number] BDonita Brooks NP ED       Prognosis:  Unable to determine, but he remains at high risk for continued decompensation and death  Discharge Planning:  To Be Determined  Care plan was discussed with patient, long term girlfriend, and brother  Thank you for allowing the Palliative Medicine Team to assist in the  care of this patient.   Time In: 1120 Time Out: 1200 Total Time 40 Prolonged Time Billed No      Greater than 50%  of this time was  spent counseling and coordinating care related to the above assessment and plan.  Micheline Rough, MD  Please contact Palliative Medicine Team phone at [phone] for questions and concerns.

## 2015-09-20 NOTE — Progress Notes (Signed)
  Echocardiogram 2D Echocardiogram has been performed.  Jennette Dubin 09/20/2015, 10:46 AM

## 2015-09-20 NOTE — Consult Note (Signed)
Date of Admission:  09/19/2015  Date of Consult:  09/20/2015  Reason for Consult: MSSA bacteremia with sepsis Referring Physician: Dr. Wendee Beavers and Terrilyn Saver auto consult   HPI: Martin Norton is an 60 y.o. male with diffuse large cell B lymphoma rx at [facility] and also having been treated at [facility], apparently with poor prognosis presented with several week history of leg pain R> left with weakness and septic shock. He has been found to have MSSA bacteremia.  He DOES have a port-a-cath that will need to be removed.  He was given IVF, IV vancomycin and zosyn but then developed lip swelling noticed by his girlfriend but not the patient. He was changed over to aztreonam, levaquin and zyvox. Since then blood cultures + BCID for MSSA.     Past Medical History  Diagnosis Date  . Diabetes mellitus without complication ([place])   . Cancer [facility])     Past Surgical History  Procedure Laterality Date  . Lymph node biopsy      Social History:  reports that he has quit smoking. His smoking use included Cigarettes. He has a 12.5 pack-year smoking history. He has never used smokeless tobacco. He reports that he drinks about 1.8 oz of alcohol per week. He reports that he does not use illicit drugs.   Family History  Problem Relation Age of Onset  . Lymphoma Mother   . Lung cancer Father   . Throat cancer Brother     Allergies  Allergen Reactions  . Zosyn [Piperacillin Sod-Tazobactam So]     Lip swelling   . Vancomycin Swelling    Lip swelling      Medications: I have reviewed patients current medications as documented in Epic Anti-infectives    Start     Dose/Rate Route Frequency Ordered Stop   09/19/15 2359  aztreonam (AZACTAM) 1 g in dextrose 5 % 50 mL IVPB  Status:  Discontinued     1 g 100 mL/hr over 30 Minutes Intravenous Every 8 hours 09/19/15 1445 09/19/15 2233   09/19/15 2200  linezolid (ZYVOX) IVPB 600 mg  Status:  Discontinued     600 mg 300 mL/hr over 60 Minutes Intravenous  Every 12 hours 09/19/15 1419 09/20/15 0841   09/19/15 1800  vancomycin (VANCOCIN) IVPB 1000 mg/200 mL premix  Status:  Discontinued     1,000 mg 200 mL/hr over 60 Minutes Intravenous Every 12 hours 09/19/15 1046 09/19/15 1347   09/19/15 1800  piperacillin-tazobactam (ZOSYN) IVPB 3.375 g  Status:  Discontinued     3.375 g 12.5 mL/hr over 240 Minutes Intravenous Every 8 hours 09/19/15 1046 09/19/15 1347   09/19/15 1800  levofloxacin (LEVAQUIN) IVPB 750 mg  Status:  Discontinued     750 mg 100 mL/hr over 90 Minutes Intravenous Every 24 hours 09/19/15 1446 09/19/15 2233   09/19/15 1600  aztreonam (AZACTAM) 2 g in dextrose 5 % 50 mL IVPB     2 g 100 mL/hr over 30 Minutes Intravenous  Once 09/19/15 1445 09/19/15 1815   09/19/15 0900  piperacillin-tazobactam (ZOSYN) IVPB 3.375 g     3.375 g 100 mL/hr over 30 Minutes Intravenous  Once 09/19/15 0855 09/19/15 1014   09/19/15 0900  vancomycin (VANCOCIN) IVPB 1000 mg/200 mL premix     1,000 mg 200 mL/hr over 60 Minutes Intravenous  Once 09/19/15 0855 09/19/15 1112         ROS:  as in HPI otherwise remainder of 12 point Review of Systems  is negative Blood pressure 132/74, pulse 84, temperature 98.6 F (37 C), temperature source Oral, resp. rate 16, height 5' 11"  (1.803 m), weight 203 lb 4.2 oz (92.2 kg), SpO2 99 %. General: Alert and awake, oriented x3, not in any acute distress. HEENT: anicteric sclera,  EOMI, oropharynx clear and without exudate Cardiovascular: regular rate, normal r,  no murmur rubs or gallops Pulmonary: clear to auscultation bilaterally, no wheezing, rales or rhonchi Gastrointestinal: soft nontender, nondistended, normal bowel sounds, Musculoskeletal: he is really NOT especially tender in thighs as I would expect Skin, soft tissue: no rashes, porta-cath overtly not infected but by definition IS Neuro: nonfocal, strength and sensation intact   Results for orders placed or performed during the hospital encounter of  09/19/15 (from the past 48 hour(s))  Basic metabolic panel     Status: Abnormal   Collection Time: 09/19/15  4:16 AM  Result Value Ref Range   Sodium 129 (L) 135 - 145 mmol/L   Potassium 3.6 3.5 - 5.1 mmol/L   Chloride 94 (L) 101 - 111 mmol/L   CO2 23 22 - 32 mmol/L   Glucose, Bld 72 65 - 99 mg/dL   BUN 27 (H) 6 - 20 mg/dL   Creatinine, Ser 0.72 0.61 - 1.24 mg/dL   Calcium 9.9 8.9 - 10.3 mg/dL   GFR calc non Af Amer >60 >60 mL/min   GFR calc Af Amer >60 >60 mL/min    Comment: (NOTE) The eGFR has been calculated using the CKD EPI equation. This calculation has not been validated in all clinical situations. eGFR's persistently <60 mL/min signify possible Chronic Kidney Disease.    Anion gap 12 5 - 15  CBC with Differential     Status: Abnormal   Collection Time: 09/19/15  4:16 AM  Result Value Ref Range   WBC 9.5 4.0 - 10.5 K/uL   RBC 3.32 (L) 4.22 - 5.81 MIL/uL   Hemoglobin 10.2 (L) 13.0 - 17.0 g/dL   HCT 29.4 (L) 39.0 - 52.0 %   MCV 88.6 78.0 - 100.0 fL   MCH 30.7 26.0 - 34.0 pg   MCHC 34.7 30.0 - 36.0 g/dL   RDW 15.3 11.5 - 15.5 %   Platelets 194 150 - 400 K/uL   Neutrophils Relative % 97 %   Neutro Abs 9.2 (H) 1.7 - 7.7 K/uL   Lymphocytes Relative 1 %   Lymphs Abs 0.1 (L) 0.7 - 4.0 K/uL   Monocytes Relative 2 %   Monocytes Absolute 0.2 0.1 - 1.0 K/uL   Eosinophils Relative 0 %   Eosinophils Absolute 0.0 0.0 - 0.7 K/uL   Basophils Relative 0 %   Basophils Absolute 0.0 0.0 - 0.1 K/uL  Hepatic function panel     Status: Abnormal   Collection Time: 09/19/15  4:22 AM  Result Value Ref Range   Total Protein 6.2 (L) 6.5 - 8.1 g/dL   Albumin 3.6 3.5 - 5.0 g/dL   AST 31 15 - 41 U/L   ALT 25 17 - 63 U/L   Alkaline Phosphatase 42 38 - 126 U/L   Total Bilirubin 0.6 0.3 - 1.2 mg/dL   Bilirubin, Direct 0.2 0.1 - 0.5 mg/dL   Indirect Bilirubin 0.4 0.3 - 0.9 mg/dL  Blood culture (routine x 2)     Status: Abnormal (Preliminary result)   Collection Time: 09/19/15  8:40 AM    Result Value Ref Range   Specimen Description BLOOD LEFT ANTECUBITAL    Special Requests BOTTLES DRAWN  AEROBIC AND ANAEROBIC 5ML    Culture  Setup Time      GRAM POSITIVE COCCI IN CLUSTERS Organism ID to follow IN BOTH AEROBIC AND ANAEROBIC BOTTLES CRITICAL RESULT CALLED TO, READ BACK BY AND VERIFIED WITH: PHARM L POWELL AT 2159 [phone] MARTINB    Culture STAPHYLOCOCCUS AUREUS (A)    Report Status PENDING   Blood Culture ID Panel (Reflexed)     Status: Abnormal   Collection Time: 09/19/15  8:40 AM  Result Value Ref Range   Enterococcus species NOT DETECTED NOT DETECTED   Vancomycin resistance NOT DETECTED NOT DETECTED   Listeria monocytogenes NOT DETECTED NOT DETECTED   Staphylococcus species DETECTED (A) NOT DETECTED    Comment: CRITICAL RESULT CALLED TO, READ BACK BY AND VERIFIED WITH: PHARM L POWELL AT 2159 [phone] MARTINB    Staphylococcus aureus DETECTED (A) NOT DETECTED    Comment: CRITICAL RESULT CALLED TO, READ BACK BY AND VERIFIED WITH: PHARM L POWELL AT 2159 [phone] MARTINB    Methicillin resistance NOT DETECTED NOT DETECTED   Streptococcus species NOT DETECTED NOT DETECTED   Streptococcus agalactiae NOT DETECTED NOT DETECTED   Streptococcus pneumoniae NOT DETECTED NOT DETECTED   Streptococcus pyogenes NOT DETECTED NOT DETECTED   Acinetobacter baumannii NOT DETECTED NOT DETECTED   Enterobacteriaceae species NOT DETECTED NOT DETECTED   Enterobacter cloacae complex NOT DETECTED NOT DETECTED   Escherichia coli NOT DETECTED NOT DETECTED   Klebsiella oxytoca NOT DETECTED NOT DETECTED   Klebsiella pneumoniae NOT DETECTED NOT DETECTED   Proteus species NOT DETECTED NOT DETECTED   Serratia marcescens NOT DETECTED NOT DETECTED   Carbapenem resistance NOT DETECTED NOT DETECTED   Haemophilus influenzae NOT DETECTED NOT DETECTED   Neisseria meningitidis NOT DETECTED NOT DETECTED   Pseudomonas aeruginosa NOT DETECTED NOT DETECTED   Candida albicans NOT DETECTED NOT  DETECTED   Candida glabrata NOT DETECTED NOT DETECTED   Candida krusei NOT DETECTED NOT DETECTED   Candida parapsilosis NOT DETECTED NOT DETECTED   Candida tropicalis NOT DETECTED NOT DETECTED  I-Stat CG4 Lactic Acid, ED     Status: Abnormal   Collection Time: 09/19/15  8:47 AM  Result Value Ref Range   Lactic Acid, Venous 4.51 (HH) 0.5 - 1.9 mmol/L   Comment NOTIFIED PHYSICIAN   Blood culture (routine x 2)     Status: Abnormal (Preliminary result)   Collection Time: 09/19/15  9:10 AM  Result Value Ref Range   Specimen Description BLOOD LEFT HAND    Special Requests IN PEDIATRIC BOTTLE 2ML    Culture  Setup Time      GRAM POSITIVE COCCI IN CLUSTERS AEROBIC BOTTLE ONLY CRITICAL RESULT CALLED TO, READ BACK BY AND VERIFIED WITHLavell Luster [facility] 2221 09/19/15 A BROWNING Performed at [place] (A)    Report Status PENDING   Acetaminophen level     Status: Abnormal   Collection Time: 09/19/15 11:49 AM  Result Value Ref Range   Acetaminophen (Tylenol), Serum <10 (L) 10 - 30 ug/mL    Comment:        THERAPEUTIC CONCENTRATIONS VARY SIGNIFICANTLY. A RANGE OF 10-30 ug/mL MAY BE AN EFFECTIVE CONCENTRATION FOR MANY PATIENTS. HOWEVER, SOME ARE BEST TREATED AT CONCENTRATIONS OUTSIDE THIS RANGE. ACETAMINOPHEN CONCENTRATIONS >150 ug/mL AT 4 HOURS AFTER INGESTION AND >50 ug/mL AT 12 HOURS AFTER INGESTION ARE OFTEN ASSOCIATED WITH TOXIC REACTIONS.   Urinalysis, Routine w reflex microscopic (not at [facility])     Status: None  Collection Time: 09/19/15 11:56 AM  Result Value Ref Range   Color, Urine YELLOW YELLOW   APPearance CLEAR CLEAR   Specific Gravity, Urine 1.020 1.005 - 1.030   pH 6.0 5.0 - 8.0   Glucose, UA NEGATIVE NEGATIVE mg/dL   Hgb urine dipstick NEGATIVE NEGATIVE   Bilirubin Urine NEGATIVE NEGATIVE   Ketones, ur NEGATIVE NEGATIVE mg/dL   Protein, ur NEGATIVE NEGATIVE mg/dL   Nitrite NEGATIVE NEGATIVE   Leukocytes, UA NEGATIVE  NEGATIVE    Comment: MICROSCOPIC NOT DONE ON URINES WITH NEGATIVE PROTEIN, BLOOD, LEUKOCYTES, NITRITE, OR GLUCOSE <1000 mg/dL.  I-Stat CG4 Lactic Acid, ED     Status: Abnormal   Collection Time: 09/19/15  2:10 PM  Result Value Ref Range   Lactic Acid, Venous 3.35 (HH) 0.5 - 1.9 mmol/L   Comment NOTIFIED PHYSICIAN   Basic metabolic panel     Status: Abnormal   Collection Time: 09/20/15  6:25 AM  Result Value Ref Range   Sodium 130 (L) 135 - 145 mmol/L   Potassium 4.6 3.5 - 5.1 mmol/L    Comment: RESULT REPEATED AND VERIFIED DELTA CHECK NOTED NO VISIBLE HEMOLYSIS    Chloride 101 101 - 111 mmol/L   CO2 21 (L) 22 - 32 mmol/L   Glucose, Bld 128 (H) 65 - 99 mg/dL   BUN 32 (H) 6 - 20 mg/dL   Creatinine, Ser 1.07 0.61 - 1.24 mg/dL   Calcium 8.4 (L) 8.9 - 10.3 mg/dL   GFR calc non Af Amer >60 >60 mL/min   GFR calc Af Amer >60 >60 mL/min    Comment: (NOTE) The eGFR has been calculated using the CKD EPI equation. This calculation has not been validated in all clinical situations. eGFR's persistently <60 mL/min signify possible Chronic Kidney Disease.    Anion gap 8 5 - 15  CBC     Status: Abnormal   Collection Time: 09/20/15  6:25 AM  Result Value Ref Range   WBC 11.3 (H) 4.0 - 10.5 K/uL   RBC 2.96 (L) 4.22 - 5.81 MIL/uL   Hemoglobin 9.2 (L) 13.0 - 17.0 g/dL   HCT 26.5 (L) 39.0 - 52.0 %   MCV 89.5 78.0 - 100.0 fL   MCH 31.1 26.0 - 34.0 pg   MCHC 34.7 30.0 - 36.0 g/dL   RDW 15.9 (H) 11.5 - 15.5 %   Platelets 135 (L) 150 - 400 K/uL   @BRIEFLABTABLE (sdes,specrequest,cult,reptstatus)   ) Recent Results (from the past 720 hour(s))  Blood culture (routine x 2)     Status: Abnormal (Preliminary result)   Collection Time: 09/19/15  8:40 AM  Result Value Ref Range Status   Specimen Description BLOOD LEFT ANTECUBITAL  Final   Special Requests BOTTLES DRAWN AEROBIC AND ANAEROBIC 5ML  Final   Culture  Setup Time   Final    GRAM POSITIVE COCCI IN CLUSTERS Organism ID to follow IN  BOTH AEROBIC AND ANAEROBIC BOTTLES CRITICAL RESULT CALLED TO, READ BACK BY AND VERIFIED WITH: PHARM L POWELL AT 2159 [phone] MARTINB    Culture STAPHYLOCOCCUS AUREUS (A)  Final   Report Status PENDING  Incomplete  Blood Culture ID Panel (Reflexed)     Status: Abnormal   Collection Time: 09/19/15  8:40 AM  Result Value Ref Range Status   Enterococcus species NOT DETECTED NOT DETECTED Final   Vancomycin resistance NOT DETECTED NOT DETECTED Final   Listeria monocytogenes NOT DETECTED NOT DETECTED Final   Staphylococcus species DETECTED (A) NOT DETECTED Final  Comment: CRITICAL RESULT CALLED TO, READ BACK BY AND VERIFIED WITH: PHARM L POWELL AT 2159 [phone] MARTINB    Staphylococcus aureus DETECTED (A) NOT DETECTED Final    Comment: CRITICAL RESULT CALLED TO, READ BACK BY AND VERIFIED WITH: PHARM L POWELL AT 2159 [phone] MARTINB    Methicillin resistance NOT DETECTED NOT DETECTED Final   Streptococcus species NOT DETECTED NOT DETECTED Final   Streptococcus agalactiae NOT DETECTED NOT DETECTED Final   Streptococcus pneumoniae NOT DETECTED NOT DETECTED Final   Streptococcus pyogenes NOT DETECTED NOT DETECTED Final   Acinetobacter baumannii NOT DETECTED NOT DETECTED Final   Enterobacteriaceae species NOT DETECTED NOT DETECTED Final   Enterobacter cloacae complex NOT DETECTED NOT DETECTED Final   Escherichia coli NOT DETECTED NOT DETECTED Final   Klebsiella oxytoca NOT DETECTED NOT DETECTED Final   Klebsiella pneumoniae NOT DETECTED NOT DETECTED Final   Proteus species NOT DETECTED NOT DETECTED Final   Serratia marcescens NOT DETECTED NOT DETECTED Final   Carbapenem resistance NOT DETECTED NOT DETECTED Final   Haemophilus influenzae NOT DETECTED NOT DETECTED Final   Neisseria meningitidis NOT DETECTED NOT DETECTED Final   Pseudomonas aeruginosa NOT DETECTED NOT DETECTED Final   Candida albicans NOT DETECTED NOT DETECTED Final   Candida glabrata NOT DETECTED NOT DETECTED Final    Candida krusei NOT DETECTED NOT DETECTED Final   Candida parapsilosis NOT DETECTED NOT DETECTED Final   Candida tropicalis NOT DETECTED NOT DETECTED Final  Blood culture (routine x 2)     Status: Abnormal (Preliminary result)   Collection Time: 09/19/15  9:10 AM  Result Value Ref Range Status   Specimen Description BLOOD LEFT HAND  Final   Special Requests IN PEDIATRIC BOTTLE 2ML  Final   Culture  Setup Time   Final    GRAM POSITIVE COCCI IN CLUSTERS AEROBIC BOTTLE ONLY CRITICAL RESULT CALLED TO, READ BACK BY AND VERIFIED WITH: Lavell Luster [facility] 2221 09/19/15 A BROWNING Performed at [place] (A)  Final   Report Status PENDING  Incomplete     Impression/Recommendation  Active Problems:   Right thigh pain   Diffuse large B-cell lymphoma (HCC)   Arterial hypotension   Lactic acid acidosis   Hypotension   Martin Norton is a 60 y.o. male with  B cell lymphoma, porta cath, leg pain and MSSA with septic shock   #1      [place] Antimicrobial Management Team Staphylococcus aureus bacteremia   Staphylococcus aureus bacteremia (SAB) is associated with a high rate of complications and mortality.  Specific aspects of clinical management are critical to optimizing the outcome of patients with SAB.  Therefore, the [facility] Antimicrobial Management Team [facility]) has initiated an intervention aimed at improving the management of SAB at [facility].  To do so, Infectious Diseases physicians are providing an evidence-based consult for the management of all patients with SAB.     Yes No Comments  Perform follow-up blood cultures (even if the patient is afebrile) to ensure clearance of bacteremia [x]  []  Repeat today and AFTER PORT REMOVED  Remove vascular catheter and obtain follow-up blood cultures after the removal of the catheter []  []  CONSULT CCS FOR PORT REMOVAL  Perform echocardiography to evaluate for endocarditis (transthoracic ECHO is  40-50% sensitive, TEE is > 90% sensitive) []  []  Please keep in mind, that neither test can definitively EXCLUDE endocarditis, and that should clinical suspicion remain high for endocarditis the patient should then still be  treated with an "endocarditis" duration of therapy = 6 weeks  HE SHOULD HAVE TTE, TEE ON Monday IF TTE UNREVEALING  Consult electrophysiologist to evaluate implanted cardiac device (pacemaker, ICD) []  []    Ensure source control []  []  Have all abscesses been drained effectively? Have deep seeded infections (septic joints or osteomyelitis) had appropriate surgical debridement?  HE NEEDS PORT REMOVED AND MRI THIGH   Investigate for "metastatic" sites of infection []  []  Does the patient have ANY symptom or physical exam finding that would suggest a deeper infection (back or neck pain that may be suggestive of vertebral osteomyelitis or epidural abscess, muscle pain that could be a symptom of pyomyositis)?  Keep in mind that for deep seeded infections MRI imaging with contrast is preferred rather than other often insensitive tests such as plain x-rays, especially early in a patient's presentation.  WILL GET MRI THIGH, consider MRI back  Also consider CT chest given pleural effusion  Change antibiotic therapy to vancomycin trial again since lip swelling likely from beta lactam []  []  Beta-lactam antibiotics are preferred for MSSA due to higher cure rates.   If on Vancomycin, goal trough should be 15 - 20 mcg/mL  Estimated duration of IV antibiotic therapy:  4-6 weeks []  []  Consult case management for probably prolonged outpatient IV antibiotic therapy   #2 Screening: will screen for HCV and HIV     I spent greater than 80  minutes with the patient including greater than 50% of time in face to face counsel of the patient and his girlfriend (on speaker phone) re his MSSA bacteremia, allergies, sepsis ,b cell lymphoma and in coordination of  His  care.   Dr. Megan Salon to check in  on over the weekend.   09/20/2015, 9:22 AM   Thank you so much for this interesting consult  [place] [facility] [place] [phone] (pager) [phone] (office) 09/20/2015, 9:22 AM  Rhina Br[place] 09/20/2015, 9:22 AM

## 2015-09-20 NOTE — Consult Note (Signed)
Reason for Consult: Port-a-Cath Removal Referring Physician:   [name] is an 60 y.o. male with a history of history of DM II, HTN, refractory diffuse large B-Cell Lymphoma and chronic pain that was admitted to [facility] on 09/19/2015 for suspected sepsis. Blood cultures indicate MSSA bacteremia. Surgery was asked to consult for Port-A-Cath Removal. Patient denies fever, nausea, vomiting. Denies complications under general anesthesia and has no known drug allergies besides lip swelling secondary to Zosyn.   Past Medical History  Diagnosis Date  . Diabetes mellitus without complication ([place])   . Cancer [facility])     Past Surgical History  Procedure Laterality Date  . Lymph node biopsy      Family History  Problem Relation Age of Onset  . Lymphoma Mother   . Lung cancer Father   . Throat cancer Brother     Social History:  reports that he has quit smoking. His smoking use included Cigarettes. He has a 12.5 pack-year smoking history. He has never used smokeless tobacco. He reports that he drinks about 1.8 oz of alcohol per week. He reports that he does not use illicit drugs.  Allergies:  Allergies  Allergen Reactions  . Zosyn [Piperacillin Sod-Tazobactam So]     Lip swelling     Prior to Admission medications   Medication Sig Start Date End Date Taking? Authorizing Provider  acetaminophen (TYLENOL) 500 MG tablet Take 1,000 mg by mouth every 6 (six) hours as needed for mild pain.    Yes Historical Provider, MD  Cholecalciferol (VITAMIN D3) 5000 UNITS CAPS Take 5,000 Units by mouth daily.   Yes Historical Provider, MD  dexamethasone (DECADRON) 1 MG tablet Take 2 mg by mouth 2 (two) times daily with a meal.  08/19/15  Yes Historical Provider, MD  fentaNYL (DURAGESIC) 25 MCG/HR patch Place 25 mcg onto the skin every 3 (three) days. 09/17/15  Yes Historical Provider, MD  losartan (COZAAR) 50 MG tablet Take 50 mg by mouth daily after supper.  06/20/14  Yes Historical Provider, MD  metFORMIN  (GLUCOPHAGE) 500 MG tablet Take 500 mg by mouth 2 (two) times daily with a meal. 06/20/14  Yes Historical Provider, MD  oxyCODONE (OXY IR/ROXICODONE) 5 MG immediate release tablet Take 5 mg by mouth every 4 (four) hours as needed for moderate pain.    Yes Historical Provider, MD  traMADol (ULTRAM) 50 MG tablet Take 50 mg by mouth every 6 (six) hours as needed for moderate pain.  08/21/15  Yes Historical Provider, MD     Results for orders placed or performed during the hospital encounter of 09/19/15 (from the past 48 hour(s))  Basic metabolic panel     Status: Abnormal   Collection Time: 09/19/15  4:16 AM  Result Value Ref Range   Sodium 129 (L) 135 - 145 mmol/L   Potassium 3.6 3.5 - 5.1 mmol/L   Chloride 94 (L) 101 - 111 mmol/L   CO2 23 22 - 32 mmol/L   Glucose, Bld 72 65 - 99 mg/dL   BUN 27 (H) 6 - 20 mg/dL   Creatinine, Ser 0.72 0.61 - 1.24 mg/dL   Calcium 9.9 8.9 - 10.3 mg/dL   GFR calc non Af Amer >60 >60 mL/min   GFR calc Af Amer >60 >60 mL/min    Comment: (NOTE) The eGFR has been calculated using the CKD EPI equation. This calculation has not been validated in all clinical situations. eGFR's persistently <60 mL/min signify possible Chronic Kidney Disease.    Anion gap  12 5 - 15  CBC with Differential     Status: Abnormal   Collection Time: 09/19/15  4:16 AM  Result Value Ref Range   WBC 9.5 4.0 - 10.5 K/uL   RBC 3.32 (L) 4.22 - 5.81 MIL/uL   Hemoglobin 10.2 (L) 13.0 - 17.0 g/dL   HCT 29.4 (L) 39.0 - 52.0 %   MCV 88.6 78.0 - 100.0 fL   MCH 30.7 26.0 - 34.0 pg   MCHC 34.7 30.0 - 36.0 g/dL   RDW 15.3 11.5 - 15.5 %   Platelets 194 150 - 400 K/uL   Neutrophils Relative % 97 %   Neutro Abs 9.2 (H) 1.7 - 7.7 K/uL   Lymphocytes Relative 1 %   Lymphs Abs 0.1 (L) 0.7 - 4.0 K/uL   Monocytes Relative 2 %   Monocytes Absolute 0.2 0.1 - 1.0 K/uL   Eosinophils Relative 0 %   Eosinophils Absolute 0.0 0.0 - 0.7 K/uL   Basophils Relative 0 %   Basophils Absolute 0.0 0.0 - 0.1 K/uL   Hepatic function panel     Status: Abnormal   Collection Time: 09/19/15  4:22 AM  Result Value Ref Range   Total Protein 6.2 (L) 6.5 - 8.1 g/dL   Albumin 3.6 3.5 - 5.0 g/dL   AST 31 15 - 41 U/L   ALT 25 17 - 63 U/L   Alkaline Phosphatase 42 38 - 126 U/L   Total Bilirubin 0.6 0.3 - 1.2 mg/dL   Bilirubin, Direct 0.2 0.1 - 0.5 mg/dL   Indirect Bilirubin 0.4 0.3 - 0.9 mg/dL  Blood culture (routine x 2)     Status: Abnormal (Preliminary result)   Collection Time: 09/19/15  8:40 AM  Result Value Ref Range   Specimen Description BLOOD LEFT ANTECUBITAL    Special Requests BOTTLES DRAWN AEROBIC AND ANAEROBIC 5ML    Culture  Setup Time      GRAM POSITIVE COCCI IN CLUSTERS Organism ID to follow IN BOTH AEROBIC AND ANAEROBIC BOTTLES CRITICAL RESULT CALLED TO, READ BACK BY AND VERIFIED WITH: PHARM L POWELL AT 2159 [phone] MARTINB    Culture STAPHYLOCOCCUS AUREUS (A)    Report Status PENDING   Blood Culture ID Panel (Reflexed)     Status: Abnormal   Collection Time: 09/19/15  8:40 AM  Result Value Ref Range   Enterococcus species NOT DETECTED NOT DETECTED   Vancomycin resistance NOT DETECTED NOT DETECTED   Listeria monocytogenes NOT DETECTED NOT DETECTED   Staphylococcus species DETECTED (A) NOT DETECTED    Comment: CRITICAL RESULT CALLED TO, READ BACK BY AND VERIFIED WITH: PHARM L POWELL AT 2159 [phone] MARTINB    Staphylococcus aureus DETECTED (A) NOT DETECTED    Comment: CRITICAL RESULT CALLED TO, READ BACK BY AND VERIFIED WITH: PHARM L POWELL AT 2159 [phone] MARTINB    Methicillin resistance NOT DETECTED NOT DETECTED   Streptococcus species NOT DETECTED NOT DETECTED   Streptococcus agalactiae NOT DETECTED NOT DETECTED   Streptococcus pneumoniae NOT DETECTED NOT DETECTED   Streptococcus pyogenes NOT DETECTED NOT DETECTED   Acinetobacter baumannii NOT DETECTED NOT DETECTED   Enterobacteriaceae species NOT DETECTED NOT DETECTED   Enterobacter cloacae complex NOT DETECTED NOT  DETECTED   Escherichia coli NOT DETECTED NOT DETECTED   Klebsiella oxytoca NOT DETECTED NOT DETECTED   Klebsiella pneumoniae NOT DETECTED NOT DETECTED   Proteus species NOT DETECTED NOT DETECTED   Serratia marcescens NOT DETECTED NOT DETECTED   Carbapenem resistance NOT DETECTED NOT DETECTED  Haemophilus influenzae NOT DETECTED NOT DETECTED   Neisseria meningitidis NOT DETECTED NOT DETECTED   Pseudomonas aeruginosa NOT DETECTED NOT DETECTED   Candida albicans NOT DETECTED NOT DETECTED   Candida glabrata NOT DETECTED NOT DETECTED   Candida krusei NOT DETECTED NOT DETECTED   Candida parapsilosis NOT DETECTED NOT DETECTED   Candida tropicalis NOT DETECTED NOT DETECTED  I-Stat CG4 Lactic Acid, ED     Status: Abnormal   Collection Time: 09/19/15  8:47 AM  Result Value Ref Range   Lactic Acid, Venous 4.51 (HH) 0.5 - 1.9 mmol/L   Comment NOTIFIED PHYSICIAN   Blood culture (routine x 2)     Status: Abnormal (Preliminary result)   Collection Time: 09/19/15  9:10 AM  Result Value Ref Range   Specimen Description BLOOD LEFT HAND    Special Requests IN PEDIATRIC BOTTLE 2ML    Culture  Setup Time      GRAM POSITIVE COCCI IN CLUSTERS AEROBIC BOTTLE ONLY CRITICAL RESULT CALLED TO, READ BACK BY AND VERIFIED WITHLavell Luster [facility] 2221 09/19/15 A BROWNING Performed at [place] (A)    Report Status PENDING   Acetaminophen level     Status: Abnormal   Collection Time: 09/19/15 11:49 AM  Result Value Ref Range   Acetaminophen (Tylenol), Serum <10 (L) 10 - 30 ug/mL    Comment:        THERAPEUTIC CONCENTRATIONS VARY SIGNIFICANTLY. A RANGE OF 10-30 ug/mL MAY BE AN EFFECTIVE CONCENTRATION FOR MANY PATIENTS. HOWEVER, SOME ARE BEST TREATED AT CONCENTRATIONS OUTSIDE THIS RANGE. ACETAMINOPHEN CONCENTRATIONS >150 ug/mL AT 4 HOURS AFTER INGESTION AND >50 ug/mL AT 12 HOURS AFTER INGESTION ARE OFTEN ASSOCIATED WITH TOXIC REACTIONS.   Urine culture      Status: None   Collection Time: 09/19/15 11:56 AM  Result Value Ref Range   Specimen Description URINE, CLEAN CATCH    Special Requests NONE    Culture NO GROWTH Performed at [facility]     Report Status 09/20/2015 FINAL   Urinalysis, Routine w reflex microscopic (not at [facility])     Status: None   Collection Time: 09/19/15 11:56 AM  Result Value Ref Range   Color, Urine YELLOW YELLOW   APPearance CLEAR CLEAR   Specific Gravity, Urine 1.020 1.005 - 1.030   pH 6.0 5.0 - 8.0   Glucose, UA NEGATIVE NEGATIVE mg/dL   Hgb urine dipstick NEGATIVE NEGATIVE   Bilirubin Urine NEGATIVE NEGATIVE   Ketones, ur NEGATIVE NEGATIVE mg/dL   Protein, ur NEGATIVE NEGATIVE mg/dL   Nitrite NEGATIVE NEGATIVE   Leukocytes, UA NEGATIVE NEGATIVE    Comment: MICROSCOPIC NOT DONE ON URINES WITH NEGATIVE PROTEIN, BLOOD, LEUKOCYTES, NITRITE, OR GLUCOSE <1000 mg/dL.  I-Stat CG4 Lactic Acid, ED     Status: Abnormal   Collection Time: 09/19/15  2:10 PM  Result Value Ref Range   Lactic Acid, Venous 3.35 (HH) 0.5 - 1.9 mmol/L   Comment NOTIFIED PHYSICIAN   Basic metabolic panel     Status: Abnormal   Collection Time: 09/20/15  6:25 AM  Result Value Ref Range   Sodium 130 (L) 135 - 145 mmol/L   Potassium 4.6 3.5 - 5.1 mmol/L    Comment: RESULT REPEATED AND VERIFIED DELTA CHECK NOTED NO VISIBLE HEMOLYSIS    Chloride 101 101 - 111 mmol/L   CO2 21 (L) 22 - 32 mmol/L   Glucose, Bld 128 (H) 65 - 99 mg/dL   BUN 32 (H) 6 -  20 mg/dL   Creatinine, Ser 1.07 0.61 - 1.24 mg/dL   Calcium 8.4 (L) 8.9 - 10.3 mg/dL   GFR calc non Af Amer >60 >60 mL/min   GFR calc Af Amer >60 >60 mL/min    Comment: (NOTE) The eGFR has been calculated using the CKD EPI equation. This calculation has not been validated in all clinical situations. eGFR's persistently <60 mL/min signify possible Chronic Kidney Disease.    Anion gap 8 5 - 15  CBC     Status: Abnormal   Collection Time: 09/20/15  6:25 AM  Result Value Ref Range    WBC 11.3 (H) 4.0 - 10.5 K/uL   RBC 2.96 (L) 4.22 - 5.81 MIL/uL   Hemoglobin 9.2 (L) 13.0 - 17.0 g/dL   HCT 26.5 (L) 39.0 - 52.0 %   MCV 89.5 78.0 - 100.0 fL   MCH 31.1 26.0 - 34.0 pg   MCHC 34.7 30.0 - 36.0 g/dL   RDW 15.9 (H) 11.5 - 15.5 %   Platelets 135 (L) 150 - 400 K/uL    Dg Chest Port 1 View  09/19/2015  CLINICAL DATA:  Non-Hodgkin's lymphoma. EXAM: PORTABLE CHEST 1 VIEW COMPARISON:  None. FINDINGS: There is pleural thickening along the lateral upper right hemithorax, measuring 6.1 x 2.3 cm. There is no edema or consolidation. The heart is borderline enlarged with pulmonary vascularity within normal limits. Atherosclerotic calcification is noted in the aorta. No adenopathy is evident. There are no blastic or lytic bone lesions evident. Port-A-Cath tip is in the superior vena cava. No evident pneumothorax. There is calcification in the left carotid artery. IMPRESSION: Pleural thickening on the right laterally. Chronicity of this finding uncertain. Given history of lymphoma and this pleural thickening, correlation with contrast enhanced chest CT may be of value to further assess. No edema or consolidation. Heart borderline enlarged. There is aortic atherosclerosis. There is also left carotid artery calcification. No adenopathy is demonstrable by radiography. Electronically Signed   By: Lowella Grip III M.D.   On: 09/19/2015 09:30    Review of Systems  Constitutional: Negative for fever and chills.  Cardiovascular: Negative for chest pain.  Musculoskeletal: Positive for myalgias and joint pain.  Skin: Negative for rash.  Neurological: Negative for dizziness and headaches.   Blood pressure 125/66, pulse 80, temperature 98.3 F (36.8 C), temperature source Oral, resp. rate 18, height _0  (1.803 m), weight 203 lb 4.2 oz (92.2 kg), SpO2 98 %. Physical Exam  Constitutional: He is oriented to person, place, and time. He appears well-developed and well-nourished.  HENT:  Head:  Normocephalic and atraumatic.  Eyes: Conjunctivae are normal. Pupils are equal, round, and reactive to light.  Neck: Normal range of motion.  Cardiovascular: Normal rate, regular rhythm, normal heart sounds and intact distal pulses.  Exam reveals no gallop and no friction rub.   No murmur heard. Respiratory: Effort normal and breath sounds normal. No respiratory distress. He has no wheezes.  GI: Soft. He exhibits no distension.  Musculoskeletal:  Patient has pitting edema bilaterally.  Neurological: He is alert and oriented to person, place, and time.  Psychiatric: He has a normal mood and affect. His behavior is normal.    Assessment/Plan: Bacteremia  Port-a-Cath to be removed tomorrow morning. Risks, benefits and alternatives of the procedure were discussed. All patient questions were answered.  FEN: NPO after midnight Dispo: OR tomorrow for Port-a-Cath removal VTE: Lovenox held    [employer] PA-SII 09/20/2015, 11:53 AM

## 2015-09-20 NOTE — Progress Notes (Signed)
ANTICOAGULATION CONSULT NOTE - Initial Consult  Pharmacy Consult for heparin Indication: VTE prophylaxis  Allergies  Allergen Reactions  . Zosyn [Piperacillin Sod-Tazobactam So]     Lip swelling     Patient Measurements: Height: 5\' 11"  (180.3 cm) Weight: 203 lb 4.2 oz (92.2 kg) IBW/kg (Calculated) : 75.3 Heparin Dosing Weight:   Vital Signs: Temp: 97.8 F (36.6 C) (07/07 1532) Temp Source: Oral (07/07 1532) BP: 122/79 mmHg (07/07 1532) Pulse Rate: 87 (07/07 1532)  Labs:  Recent Labs  09/19/15 0416 09/20/15 0625  HGB 10.2* 9.2*  HCT 29.4* 26.5*  PLT 194 135*  CREATININE 0.72 1.07    Estimated Creatinine Clearance: 84.2 mL/min (by C-G formula based on Cr of 1.07).   Medical History: Past Medical History  Diagnosis Date  . Diabetes mellitus without complication ([place])   . Cancer [facility])     Assessment: 60 YOM with B-Cell lymphoma and MSSA bacteremia, pharmacy asked to dose heparin for VTE prophylaxis.  He was on enoxaparin (last dose 2200 on 7/6) but was stopped by surgery due to plans for [facility] removal 7/8 am.     Plan:  - heparin 5000 unit SQ q8h - starting at 2200 7/8 as patient having PAC removed 7/8am - No adjustment needed, pharmacy to [place], PharmD, BCPS.   Pager: [phone] 09/20/2015 4:45 PM

## 2015-09-20 NOTE — Progress Notes (Signed)
PROGRESS NOTE    Martin Norton  [id number] DOB: 19-Feb-1956 DOA: 09/19/2015 PCP: Red Christians, MD   Brief Narrative:  60 y.o. 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 He report his pain is severe, pain medications not helping at home. Has history of diffuse large cell lymphoma.    Assessment & Plan:   Active Problems:     Septic shock (HCC) - Continue IV antibiotic regimen  MSSA bacteremia -ID on board and managing antibiotic regimen. - Continue supportive therapy - Consulted general surgery for evaluation and possible removal of port   Right thigh pain - suspect it is secondary to diffuse large B cell lymphoma - Ultrasound of lower extremities negative for deep vein thrombosis.    Diffuse large B-cell lymphoma (HCC) - We'll have patient follow-up with oncologist    Arterial hypotension   Lactic acid acidosis - Likely secondary to sepsis    Hypotension - Improved with IV rehydration   Staphylococcus aureus bacteremia with sepsis (HCC)    Leg pain, right   Pleural effusion   Infected venous access port  DVT prophylaxis: We will place on heparin Code Status: DO NOT RESUSCITATE Family Communication: Discussed directly with patient Disposition Plan: Pending improvement in condition  Consultants:   General surgery  ID  Palliative care  Procedures: None   Antimicrobials:  Vancomycin  Subjective: Pt has no new complaints. No acute issues overnight.  Objective: Filed Vitals:   09/20/15 0800 09/20/15 0939 09/20/15 1001 09/20/15 1532  BP:  125/66  122/79  Pulse:  80  87  Temp:  98.3 F (36.8 C)  97.8 F (36.6 C)  TempSrc:  Oral  Oral  Resp: 18 16 18 18   Height:      Weight:      SpO2: 96% 99% 98% 98%     Intake/Output Summary (Last 24 hours) at 09/20/15 1621 Last data filed at 09/20/15 0935  Gross per 24 hour  Intake    600 ml  Output    725 ml  Net   -125 ml   Filed Weights   09/19/15 0937 09/19/15 1601  Weight: 86.183 kg (190 lb) 92.2 kg (203 lb 4.2 oz)    Examination:  General exam: Appears calm and comfortable  Respiratory system: Clear to auscultation. Respiratory effort normal. Cardiovascular system: S1 & S2 heard, RRR. No rubs Gastrointestinal system: Abdomen is nondistended, soft and nontender. No organomegaly or masses felt. Normal bowel sounds heard. Central nervous system: Alert and oriented. No focal neurological deficits. Extremities: Symmetric 5 x 5 power. Skin: No rashes, lesions or ulcers Psychiatry: Judgement and insight appear normal. Mood & affect appropriate.   Data Reviewed: I have personally reviewed following labs and imaging studies  CBC:  Recent Labs Lab 09/19/15 0416 09/20/15 0625  WBC 9.5 11.3*  NEUTROABS 9.2*  --   HGB 10.2* 9.2*  HCT 29.4* 26.5*  MCV 88.6 89.5  PLT 194 [id number]*   Basic Metabolic Panel:  Recent Labs Lab 09/19/15 0416 09/20/15 0625  NA 129* 130*  K 3.6 4.6  CL 94* 101  CO2 23 21*  GLUCOSE 72 128*  BUN 27* 32*  CREATININE 0.72 1.07  CALCIUM 9.9 8.4*   GFR: Estimated Creatinine Clearance: 84.2 mL/min (by C-G formula based on Cr of 1.07). Liver Function  Tests:  Recent Labs Lab 09/19/15 0422  AST 31  ALT 25  ALKPHOS 42  BILITOT 0.6  PROT 6.2*  ALBUMIN 3.6   No results for input(s): LIPASE, AMYLASE in the last 168 hours. No results for input(s): AMMONIA in the last 168 hours. Coagulation Profile: No results for input(s): INR, PROTIME in the last 168 hours. Cardiac Enzymes: No results for input(s): CKTOTAL, CKMB, CKMBINDEX, TROPONINI in the last 168 hours. BNP (last 3 results) No results for input(s): PROBNP in the last 8760 hours. HbA1C: No results for input(s): HGBA1C in the last 72 hours. CBG: No  results for input(s): GLUCAP in the last 168 hours. Lipid Profile: No results for input(s): CHOL, HDL, LDLCALC, TRIG, CHOLHDL, LDLDIRECT in the last 72 hours. Thyroid Function Tests: No results for input(s): TSH, T4TOTAL, FREET4, T3FREE, THYROIDAB in the last 72 hours. Anemia Panel: No results for input(s): VITAMINB12, FOLATE, FERRITIN, TIBC, IRON, RETICCTPCT in the last 72 hours. Sepsis Labs:  Recent Labs Lab 09/19/15 0847 09/19/15 1410  LATICACIDVEN 4.51* 3.35*    Recent Results (from the past 240 hour(s))  Blood culture (routine x 2)     Status: Abnormal (Preliminary result)   Collection Time: 09/19/15  8:40 AM  Result Value Ref Range Status   Specimen Description BLOOD LEFT ANTECUBITAL  Final   Special Requests BOTTLES DRAWN AEROBIC AND ANAEROBIC 5ML  Final   Culture  Setup Time   Final    GRAM POSITIVE COCCI IN CLUSTERS Organism ID to follow IN BOTH AEROBIC AND ANAEROBIC BOTTLES CRITICAL RESULT CALLED TO, READ BACK BY AND VERIFIED WITH: PHARM L POWELL AT 2159 [id number] MARTINB    Culture STAPHYLOCOCCUS AUREUS (A)  Final   Report Status PENDING  Incomplete  Blood Culture ID Panel (Reflexed)     Status: Abnormal   Collection Time: 09/19/15  8:40 AM  Result Value Ref Range Status   Enterococcus species NOT DETECTED NOT DETECTED Final   Vancomycin resistance NOT DETECTED NOT DETECTED Final   Listeria monocytogenes NOT DETECTED NOT DETECTED Final   Staphylococcus species DETECTED (A) NOT DETECTED Final    Comment: CRITICAL RESULT CALLED TO, READ BACK BY AND VERIFIED WITH: PHARM L POWELL AT 2159 [id number] MARTINB    Staphylococcus aureus DETECTED (A) NOT DETECTED Final    Comment: CRITICAL RESULT CALLED TO, READ BACK BY AND VERIFIED WITH: PHARM L POWELL AT 2159 [id number] MARTINB    Methicillin resistance NOT DETECTED NOT DETECTED Final   Streptococcus species NOT DETECTED NOT DETECTED Final   Streptococcus agalactiae NOT DETECTED NOT DETECTED Final   Streptococcus  pneumoniae NOT DETECTED NOT DETECTED Final   Streptococcus pyogenes NOT DETECTED NOT DETECTED Final   Acinetobacter baumannii NOT DETECTED NOT DETECTED Final   Enterobacteriaceae species NOT DETECTED NOT DETECTED Final   Enterobacter cloacae complex NOT DETECTED NOT DETECTED Final   Escherichia coli NOT DETECTED NOT DETECTED Final   Klebsiella oxytoca NOT DETECTED NOT DETECTED Final   Klebsiella pneumoniae NOT DETECTED NOT DETECTED Final   Proteus species NOT DETECTED NOT DETECTED Final   Serratia marcescens NOT DETECTED NOT DETECTED Final   Carbapenem resistance NOT DETECTED NOT DETECTED Final   Haemophilus influenzae NOT DETECTED NOT DETECTED Final   Neisseria meningitidis NOT DETECTED NOT DETECTED Final   Pseudomonas aeruginosa NOT DETECTED NOT DETECTED Final   Candida albicans NOT DETECTED NOT DETECTED Final   Candida glabrata NOT DETECTED NOT DETECTED Final   Candida krusei NOT DETECTED NOT DETECTED Final   Candida parapsilosis NOT DETECTED  NOT DETECTED Final   Candida tropicalis NOT DETECTED NOT DETECTED Final  Blood culture (routine x 2)     Status: Abnormal (Preliminary result)   Collection Time: 09/19/15  9:10 AM  Result Value Ref Range Status   Specimen Description BLOOD LEFT HAND  Final   Special Requests IN PEDIATRIC BOTTLE 2ML  Final   Culture  Setup Time   Final    GRAM POSITIVE COCCI IN CLUSTERS AEROBIC BOTTLE ONLY CRITICAL RESULT CALLED TO, READ BACK BY AND VERIFIED WITHLavell Luster [facility] 2221 09/19/15 A BROWNING Performed at [place] (A)  Final   Report Status PENDING  Incomplete  Urine culture     Status: None   Collection Time: 09/19/15 11:56 AM  Result Value Ref Range Status   Specimen Description URINE, CLEAN CATCH  Final   Special Requests NONE  Final   Culture NO GROWTH Performed at [facility]   Final   Report Status 09/20/2015 FINAL  Final         Radiology Studies: Mr Femur Right W Wo  Contrast  09/20/2015  CLINICAL DATA:  Right femur pain for 18 months. History of lymphoma. EXAM: MRI OF THE RIGHT FEMUR WITHOUT AND WITH CONTRAST TECHNIQUE: Multiplanar, multisequence MR imaging of the lower right extremity was performed both before and after administration of intravenous contrast. CONTRAST:  31mL MULTIHANCE GADOBENATE DIMEGLUMINE 529 MG/ML IV SOLN COMPARISON:  None. FINDINGS: There is an enhancing 3.6 x 5.1 x 2.9 cm soft tissue mass in the right buttock deep to and possibly involving the right gluteus maximus muscle immediately adjacent to the right sciatic nerve. The patient has extensive patchy edema throughout muscles of both thighs, more prominent on the right than the left. There is asymmetric increased edema of the right adductor muscles of the right side of the pelvis. The femur appears normal. No discrete hip effusion. Moderate bilateral knee effusions. There is circumferential subcutaneous edema and both thighs and in the scrotum. IMPRESSION: 1. Soft tissue mass immediately adjacent to the right sciatic nerve posterior to the right acetabulum and deep to the right gluteus maximus muscle consistent with lymphoma. 2. Asymmetric patchy extensive edema in the muscles of both thighs, right greater than left. This is consistent with nonspecific myositis. Electronically Signed   By: Lorriane Shire M.D.   On: 09/20/2015 15:04   Dg Chest Port 1 View  09/19/2015  CLINICAL DATA:  Non-Hodgkin's lymphoma. EXAM: PORTABLE CHEST 1 VIEW COMPARISON:  None. FINDINGS: There is pleural thickening along the lateral upper right hemithorax, measuring 6.1 x 2.3 cm. There is no edema or consolidation. The heart is borderline enlarged with pulmonary vascularity within normal limits. Atherosclerotic calcification is noted in the aorta. No adenopathy is evident. There are no blastic or lytic bone lesions evident. Port-A-Cath tip is in the superior vena cava. No evident pneumothorax. There is calcification in the  left carotid artery. IMPRESSION: Pleural thickening on the right laterally. Chronicity of this finding uncertain. Given history of lymphoma and this pleural thickening, correlation with contrast enhanced chest CT may be of value to further assess. No edema or consolidation. Heart borderline enlarged. There is aortic atherosclerosis. There is also left carotid artery calcification. No adenopathy is demonstrable by radiography. Electronically Signed   By: Lowella Grip III M.D.   On: 09/19/2015 09:30        Scheduled Meds: . dexamethasone  8 mg Intravenous Q6H  . famotidine (PEPCID) IV  20  mg Intravenous Q12H  . fentaNYL  50 mcg Transdermal Q72H  . fentaNYL   Intravenous Q4H  . vancomycin  1,250 mg Intravenous Q12H   Continuous Infusions: . sodium chloride 100 mL/hr at 09/19/15 2222    Time spent: > 35 minutes  Velvet Bathe, MD [facility] Pager [phone]  If 7PM-7AM, please contact night-coverage [email or web address] Password [email or web address] 09/20/2015, 4:21 PM

## 2015-09-21 ENCOUNTER — Encounter (HOSPITAL_COMMUNITY): Payer: Self-pay | Admitting: Certified Registered Nurse Anesthetist

## 2015-09-21 ENCOUNTER — Encounter (HOSPITAL_COMMUNITY)
Admission: EM | Disposition: A | Discharge status: Skilled Nursing Facility | Payer: Self-pay | Source: Home / Self Care | Attending: Family Medicine

## 2015-09-21 ENCOUNTER — Inpatient Hospital Stay (HOSPITAL_COMMUNITY): Payer: BLUE CROSS/BLUE SHIELD | Admitting: Anesthesiology

## 2015-09-21 DIAGNOSIS — T80219S Unspecified infection due to central venous catheter, sequela: Secondary | ICD-10-CM

## 2015-09-21 DIAGNOSIS — A4901 Methicillin susceptible Staphylococcus aureus infection, unspecified site: Secondary | ICD-10-CM

## 2015-09-21 DIAGNOSIS — R222 Localized swelling, mass and lump, trunk: Secondary | ICD-10-CM

## 2015-09-21 DIAGNOSIS — R931 Abnormal findings on diagnostic imaging of heart and coronary circulation: Secondary | ICD-10-CM

## 2015-09-21 DIAGNOSIS — R600 Localized edema: Secondary | ICD-10-CM

## 2015-09-21 DIAGNOSIS — Z7189 Other specified counseling: Secondary | ICD-10-CM | POA: Diagnosis present

## 2015-09-21 DIAGNOSIS — Z515 Encounter for palliative care: Secondary | ICD-10-CM | POA: Diagnosis present

## 2015-09-21 HISTORY — PX: PORT-A-CATH REMOVAL: SHX5289

## 2015-09-21 LAB — VANCOMYCIN, TROUGH: Vancomycin Tr: 19 ug/mL (ref 15–20)

## 2015-09-21 LAB — SEDIMENTATION RATE: SED RATE: 102 mm/h — AB (ref 0–16)

## 2015-09-21 LAB — SURGICAL PCR SCREEN
MRSA, PCR: NEGATIVE
STAPHYLOCOCCUS AUREUS: POSITIVE — AB

## 2015-09-21 LAB — C-REACTIVE PROTEIN: CRP: 19.1 mg/dL — ABNORMAL HIGH (ref <1.0)

## 2015-09-21 LAB — BASIC METABOLIC PANEL: CREATININE: 0.8 mg/dL (ref 0.6–1.3)

## 2015-09-21 LAB — CREATININE, SERUM
Creatinine, Ser: 0.82 mg/dL (ref 0.61–1.24)
GFR calc Af Amer: >60 mL/min (ref >60)
GFR calc non Af Amer: >60 mL/min (ref >60)

## 2015-09-21 LAB — GLUCOSE, CAPILLARY: GLUCOSE-CAPILLARY: 160 mg/dL — AB (ref 65–99)

## 2015-09-21 LAB — HIV ANTIBODY (ROUTINE TESTING W REFLEX): HIV SCREEN 4TH GENERATION: NONREACTIVE

## 2015-09-21 SURGERY — REMOVAL PORT-A-CATH
Anesthesia: Monitor Anesthesia Care | Site: Chest

## 2015-09-21 MED ORDER — MIDAZOLAM HCL 2 MG/2ML IJ SOLN
INTRAMUSCULAR | Status: AC
  Filled 2015-09-21: qty 2

## 2015-09-21 MED ORDER — OXYCODONE HCL 5 MG PO TABS: 5.0000 mg | ORAL_TABLET | Freq: Once | ORAL | Status: DC | PRN

## 2015-09-21 MED ORDER — EPHEDRINE SULFATE 50 MG/ML IJ SOLN
INTRAMUSCULAR | Status: AC
  Filled 2015-09-21: qty 1

## 2015-09-21 MED ORDER — PROPOFOL 10 MG/ML IV BOLUS
INTRAVENOUS | Status: AC
  Filled 2015-09-21: qty 20

## 2015-09-21 MED ORDER — DEXAMETHASONE SODIUM PHOSPHATE 10 MG/ML IJ SOLN
8.0000 mg | Freq: Three times a day (TID) | INTRAMUSCULAR | Status: DC
  Administered 2015-09-21 – 2015-09-22 (×3): 8 mg via INTRAVENOUS
  Filled 2015-09-21 (×3): qty 1

## 2015-09-21 MED ORDER — SODIUM CHLORIDE 0.9 % IJ SOLN
INTRAMUSCULAR | Status: AC
  Filled 2015-09-21: qty 10

## 2015-09-21 MED ORDER — 0.9 % SODIUM CHLORIDE (POUR BTL) OPTIME
TOPICAL | Status: DC | PRN
  Administered 2015-09-21: 1000 mL

## 2015-09-21 MED ORDER — LIDOCAINE-EPINEPHRINE 1 %-1:100000 IJ SOLN
INTRAMUSCULAR | Status: AC
  Filled 2015-09-21: qty 1

## 2015-09-21 MED ORDER — PROPOFOL 10 MG/ML IV BOLUS
INTRAVENOUS | Status: DC | PRN
  Administered 2015-09-21: 30 mg via INTRAVENOUS

## 2015-09-21 MED ORDER — PROPOFOL 500 MG/50ML IV EMUL
INTRAVENOUS | Status: DC | PRN
  Administered 2015-09-21: 50 ug/kg/min via INTRAVENOUS

## 2015-09-21 MED ORDER — OXYCODONE HCL 5 MG/5ML PO SOLN: 5.0000 mg | Freq: Once | ORAL | Status: DC | PRN

## 2015-09-21 MED ORDER — FENTANYL CITRATE (PF) 100 MCG/2ML IJ SOLN
INTRAMUSCULAR | Status: DC | PRN
  Administered 2015-09-21: 100 ug via INTRAVENOUS

## 2015-09-21 MED ORDER — FENTANYL CITRATE (PF) 100 MCG/2ML IJ SOLN
INTRAMUSCULAR | Status: AC
  Filled 2015-09-21: qty 2

## 2015-09-21 MED ORDER — FENTANYL CITRATE (PF) 100 MCG/2ML IJ SOLN: 25.0000 ug | INTRAMUSCULAR | Status: DC | PRN

## 2015-09-21 MED ORDER — PROMETHAZINE HCL 25 MG/ML IJ SOLN: 6.2500 mg | INTRAMUSCULAR | Status: DC | PRN

## 2015-09-21 MED ORDER — HYDROCODONE-ACETAMINOPHEN 5-325 MG PO TABS: 1.0000 | ORAL_TABLET | ORAL | Status: DC | PRN

## 2015-09-21 MED ORDER — LACTATED RINGERS IV SOLN
INTRAVENOUS | Status: DC | PRN
  Administered 2015-09-21: 08:00:00 via INTRAVENOUS

## 2015-09-21 MED ORDER — MEPERIDINE HCL 50 MG/ML IJ SOLN: 6.2500 mg | INTRAMUSCULAR | Status: DC | PRN

## 2015-09-21 MED ORDER — PROPOFOL 10 MG/ML IV BOLUS
INTRAVENOUS | Status: AC
  Filled 2015-09-21: qty 40

## 2015-09-21 MED ORDER — LIDOCAINE-EPINEPHRINE (PF) 1 %-1:200000 IJ SOLN
INTRAMUSCULAR | Status: DC | PRN
  Administered 2015-09-21: 10 mL

## 2015-09-21 MED ORDER — MIDAZOLAM HCL 5 MG/5ML IJ SOLN
INTRAMUSCULAR | Status: DC | PRN
  Administered 2015-09-21: 2 mg via INTRAVENOUS

## 2015-09-21 SURGICAL SUPPLY — 25 items
BLADE SURG 15 STRL LF DISP TIS (BLADE) ×1 IMPLANT
BLADE SURG 15 STRL SS (BLADE) ×2
CHLORAPREP W/TINT 26ML (MISCELLANEOUS) ×3 IMPLANT
CLOSURE STERI-STRIP 1/4X4 (GAUZE/BANDAGES/DRESSINGS) ×3 IMPLANT
COVER SURGICAL LIGHT HANDLE (MISCELLANEOUS) ×3 IMPLANT
DECANTER SPIKE VIAL GLASS SM (MISCELLANEOUS) ×3 IMPLANT
DRAPE LAPAROTOMY TRNSV 102X78 (DRAPE) ×3 IMPLANT
ELECT COATED BLADE 2.86 ST (ELECTRODE) IMPLANT
ELECT PENCIL ROCKER SW 15FT (MISCELLANEOUS) ×3 IMPLANT
ELECT REM PT RETURN 9FT ADLT (ELECTROSURGICAL) ×3
ELECTRODE REM PT RTRN 9FT ADLT (ELECTROSURGICAL) ×1 IMPLANT
GAUZE SPONGE 4X4 12PLY STRL (GAUZE/BANDAGES/DRESSINGS) IMPLANT
GAUZE SPONGE 4X4 16PLY XRAY LF (GAUZE/BANDAGES/DRESSINGS) ×3 IMPLANT
GLOVE SURG SIGNA 7.5 PF LTX (GLOVE) ×3 IMPLANT
GOWN STRL REUS W/TWL XL LVL3 (GOWN DISPOSABLE) ×6 IMPLANT
KIT BASIN OR (CUSTOM PROCEDURE TRAY) ×3 IMPLANT
LIQUID BAND (GAUZE/BANDAGES/DRESSINGS) ×3 IMPLANT
NEEDLE HYPO 22GX1.5 SAFETY (NEEDLE) ×3 IMPLANT
PACK BASIC VI WITH GOWN DISP (CUSTOM PROCEDURE TRAY) ×3 IMPLANT
SUT MNCRL AB 4-0 PS2 18 (SUTURE) ×3 IMPLANT
SUT VIC AB 3-0 SH 18 (SUTURE) ×3 IMPLANT
SYR BULB IRRIGATION 50ML (SYRINGE) ×3 IMPLANT
SYR CONTROL 10ML LL (SYRINGE) ×3 IMPLANT
TOWEL OR 17X26 10 PK STRL BLUE (TOWEL DISPOSABLE) ×3 IMPLANT
YANKAUER SUCT BULB TIP 10FT TU (MISCELLANEOUS) IMPLANT

## 2015-09-21 NOTE — Transfer of Care (Signed)
Immediate Anesthesia Transfer of Care Note  Patient: Martin Norton  Procedure(s) Performed: Procedure(s): REMOVAL PORT-A-CATH (N/A)  Patient Location: PACU  Anesthesia Type:MAC  Level of Consciousness: awake, alert  and oriented  Airway & Oxygen Therapy: Patient Spontanous Breathing and Patient connected to face mask oxygen  Post-op Assessment: Report given to RN and Post -op Vital signs reviewed and stable  Post vital signs: Reviewed and stable  Last Vitals:  Filed Vitals:   09/21/15 0626 09/21/15 0745  BP: 144/78   Pulse: 90   Temp: 36.6 C   Resp: 20 20    Last Pain:  Filed Vitals:   09/21/15 0745  PainSc: 0-No pain         Complications: No apparent anesthesia complications

## 2015-09-21 NOTE — Progress Notes (Signed)
Patient ID: Martin Norton, male   DOB: 1955/06/01, 60 y.o.   MRN: [id number]         [facility]    Date of Admission:  09/19/2015           Day 3 vancomycin  Active Problems:   Staphylococcus aureus bacteremia with sepsis [facility])   Right thigh pain   Diffuse large B-cell lymphoma (HCC)   Arterial hypotension   Lactic acid acidosis   Hypotension   Septic shock (HCC)   Leg pain, right   Pleural effusion   Infected venous access port   Palliative care encounter   Goals of care, counseling/discussion   . dexamethasone  8 mg Intravenous Q8H  . famotidine (PEPCID) IV  20 mg Intravenous Q12H  . fentaNYL  50 mcg Transdermal Q72H  . fentaNYL   Intravenous Q4H  . heparin subcutaneous  5,000 Units Subcutaneous Q8H  . vancomycin  1,250 mg Intravenous Q12H    SUBJECTIVE: He is feeling better. His chronic thigh and buttock pain is better. He tells me that he has noted some right buttock knots for the past month. They have not changed. His Port-A-Cath was removed this morning.  Review of Systems: Review of Systems  Constitutional: Positive for malaise/fatigue. Negative for fever, chills, weight loss and diaphoresis.  HENT: Negative for sore throat.   Respiratory: Negative for cough, sputum production and shortness of breath.   Cardiovascular: Negative for chest pain.  Gastrointestinal: Negative for nausea, vomiting, abdominal pain and diarrhea.  Musculoskeletal: Positive for myalgias and joint pain.  Skin: Negative for rash.  Neurological: Negative for headaches.    Past Medical History  Diagnosis Date  . Diabetes mellitus without complication ([place])   . Cancer [facility] [place])     Social History  Substance Use Topics  . Smoking status: Former Smoker -- 0.50 packs/day for 25 years    Types: Cigarettes  . Smokeless tobacco: Never Used  . Alcohol Use: 1.8 oz/week    3 Cans of beer per week     Comment: occassionally    Family History  Problem Relation Age of  Onset  . Lymphoma Mother   . Lung cancer Father   . Throat cancer Brother    Allergies  Allergen Reactions  . Zosyn [Piperacillin Sod-Tazobactam So]     Lip swelling     OBJECTIVE: Filed Vitals:   09/21/15 0930 09/21/15 0931 09/21/15 0945 09/21/15 1200  BP: 131/74  140/86   Pulse:  74    Temp: 97.6 F (36.4 C)  97.6 F (36.4 C)   TempSrc:      Resp: 18 21 18 20   Height:      Weight:      SpO2:  92% 99% 100%   Body mass index is 28.36 kg/(m^2).  Physical Exam  Constitutional:  He is alert, smiling and in good spirits.  HENT:  Mouth/Throat: No oropharyngeal exudate.  Cardiovascular: Normal rate and regular rhythm.   No murmur heard. Pulmonary/Chest: Effort normal and breath sounds normal.  Some bruising on his left anterior chest where his Port-A-Cath was removed.  Musculoskeletal:  Diffuse nonpitting edema of his thighs. There is no warmth, redness, fluctuance or pain with palpation to suggest infection of his thigh or buttocks.    Lab Results Lab Results  Component Value Date   WBC 11.3* 09/20/2015   HGB 9.2* 09/20/2015   HCT 26.5* 09/20/2015   MCV 89.5 09/20/2015   PLT 135* 09/20/2015  Lab Results  Component Value Date   CREATININE 1.07 09/20/2015   BUN 32* 09/20/2015   NA 130* 09/20/2015   K 4.6 09/20/2015   CL 101 09/20/2015   CO2 21* 09/20/2015    Lab Results  Component Value Date   ALT 25 09/19/2015   AST 31 09/19/2015   ALKPHOS 42 09/19/2015   BILITOT 0.6 09/19/2015     Microbiology: Recent Results (from the past 240 hour(s))  Blood culture (routine x 2)     Status: Abnormal (Preliminary result)   Collection Time: 09/19/15  8:40 AM  Result Value Ref Range Status   Specimen Description BLOOD LEFT ANTECUBITAL  Final   Special Requests BOTTLES DRAWN AEROBIC AND ANAEROBIC 5ML  Final   Culture  Setup Time   Final    GRAM POSITIVE COCCI IN CLUSTERS IN BOTH AEROBIC AND ANAEROBIC BOTTLES CRITICAL RESULT CALLED TO, READ BACK BY AND VERIFIED  WITH: PHARM L POWELL AT 2159 [id number] [place]    Culture (A)  Final    STAPHYLOCOCCUS AUREUS SUSCEPTIBILITIES TO FOLLOW Performed at [facility]    Report Status PENDING  Incomplete  Blood Culture ID Panel (Reflexed)     Status: Abnormal   Collection Time: 09/19/15  8:40 AM  Result Value Ref Range Status   Enterococcus species NOT DETECTED NOT DETECTED Final   Vancomycin resistance NOT DETECTED NOT DETECTED Final   Listeria monocytogenes NOT DETECTED NOT DETECTED Final   Staphylococcus species DETECTED (A) NOT DETECTED Final    Comment: CRITICAL RESULT CALLED TO, READ BACK BY AND VERIFIED WITH: PHARM L POWELL AT 2159 [id number] MARTINB    Staphylococcus aureus DETECTED (A) NOT DETECTED Final    Comment: CRITICAL RESULT CALLED TO, READ BACK BY AND VERIFIED WITH: PHARM L POWELL AT 2159 [id number] MARTINB    Methicillin resistance NOT DETECTED NOT DETECTED Final   Streptococcus species NOT DETECTED NOT DETECTED Final   Streptococcus agalactiae NOT DETECTED NOT DETECTED Final   Streptococcus pneumoniae NOT DETECTED NOT DETECTED Final   Streptococcus pyogenes NOT DETECTED NOT DETECTED Final   Acinetobacter baumannii NOT DETECTED NOT DETECTED Final   Enterobacteriaceae species NOT DETECTED NOT DETECTED Final   Enterobacter cloacae complex NOT DETECTED NOT DETECTED Final   Escherichia coli NOT DETECTED NOT DETECTED Final   Klebsiella oxytoca NOT DETECTED NOT DETECTED Final   Klebsiella pneumoniae NOT DETECTED NOT DETECTED Final   Proteus species NOT DETECTED NOT DETECTED Final   Serratia marcescens NOT DETECTED NOT DETECTED Final   Carbapenem resistance NOT DETECTED NOT DETECTED Final   Haemophilus influenzae NOT DETECTED NOT DETECTED Final   Neisseria meningitidis NOT DETECTED NOT DETECTED Final   Pseudomonas aeruginosa NOT DETECTED NOT DETECTED Final   Candida albicans NOT DETECTED NOT DETECTED Final   Candida glabrata NOT DETECTED NOT DETECTED Final   Candida krusei NOT  DETECTED NOT DETECTED Final   Candida parapsilosis NOT DETECTED NOT DETECTED Final   Candida tropicalis NOT DETECTED NOT DETECTED Final  Blood culture (routine x 2)     Status: Abnormal (Preliminary result)   Collection Time: 09/19/15  9:10 AM  Result Value Ref Range Status   Specimen Description BLOOD LEFT HAND  Final   Special Requests IN PEDIATRIC BOTTLE 2ML  Final   Culture  Setup Time   Final    GRAM POSITIVE COCCI IN CLUSTERS AEROBIC BOTTLE ONLY CRITICAL RESULT CALLED TO, READ BACK BY AND VERIFIED WITHLavell Luster [facility] 2221 09/19/15 A BROWNING Performed at [facility]  Culture STAPHYLOCOCCUS AUREUS (A)  Final   Report Status PENDING  Incomplete  Urine culture     Status: None   Collection Time: 09/19/15 11:56 AM  Result Value Ref Range Status   Specimen Description URINE, CLEAN CATCH  Final   Special Requests NONE  Final   Culture NO GROWTH Performed at [facility]   Final   Report Status 09/20/2015 FINAL  Final  Culture, blood (Routine X 2) w Reflex to ID Panel     Status: None (Preliminary result)   Collection Time: 09/20/15  9:15 AM  Result Value Ref Range Status   Specimen Description BLOOD LEFT HAND  Final   Special Requests IN PEDIATRIC BOTTLE 4CC  Final   Culture  Setup Time   Final    GRAM POSITIVE COCCI IN CLUSTERS AEROBIC BOTTLE ONLY CRITICAL RESULT CALLED TO, READ BACK BY AND VERIFIED WITH: A RUNYON 09/21/15 @ 0751 M VESTAL Performed at [place]  Final   Report Status PENDING  Incomplete  Culture, blood (Routine X 2) w Reflex to ID Panel     Status: None (Preliminary result)   Collection Time: 09/20/15  9:15 AM  Result Value Ref Range Status   Specimen Description BLOOD LEFT HAND  Final   Special Requests IN PEDIATRIC BOTTLE 3CC  Final   Culture  Setup Time   Final    GRAM POSITIVE COCCI IN CLUSTERS AEROBIC BOTTLE ONLY CRITICAL RESULT CALLED TO, READ BACK BY AND VERIFIED WITH: A RUNYON 09/21/15 @  0751 M VESTAL Performed at [place]  Final   Report Status PENDING  Incomplete   Transthoracic echocardiogram  Study Conclusions  - Left ventricle: The cavity size was normal. There was moderate  focal basal hypertrophy. Systolic function was mildly reduced.  The estimated ejection fraction was in the range of 45% to 50%.  Diffuse hypokinesis. There was an increased relative contribution  of atrial contraction to ventricular filling. Doppler parameters  are consistent with abnormal left ventricular relaxation (grade 1  diastolic dysfunction). - Aortic valve: Moderate diffuse thickening and calcification. - Mitral valve: Mild calcification of the anterior mitral valve  leaflet. There is a very small mobile density in the LV cavity  that is most consistent with redundant chordae tendinae but if  endocarditis is of concern then recommend TEE to rule out  vegetation. There was trivial regurgitation. - Recommendations: Consider transesophageal echocardiography if  clinically indicated in order to exclude mitral valve vegetation.  Recommendations: Consider transesophageal echocardiography if clinically indicated in order to exclude mitral valve vegetation.  ASSESSMENT: He has MSSA bacteremia. Repeat blood cultures are positive at 24 hours. I will repeat 2 more sets of blood cultures today. His MRI shows some stranding and nodularity in his right buttocks but there is no clinical evidence of abscess there. His transthoracic echocardiogram showed a very small mobile density in the left ventricular cavity that could be a vegetation. He will need a TEE next week. He has a history of penicillin allergy. He is tolerating vancomycin now but it may be worth considering a challenge of cefazolin.  PLAN: 1. Continue vancomycin for now 2. Repeat blood cultures 3. Recommend TEE next week 4. Please call me for any infectious disease questions  this weekend  Michel Bickers, MD [facility] [place] [phone] pager   [phone] cell 09/21/2015, 1:45 PM

## 2015-09-21 NOTE — Progress Notes (Signed)
Pharmacy Antibiotic Follow-up Note  Martin Norton is a 60 y.o. year-old male admitted on 09/19/2015.  One dose of Vancomycin & Zosyn given in ED on 7/6, for probable sepsis. Swelling of upper lip noted by girlfriend, attributed to abx, unable to distinguish if Vancomycin or Zosyn.  Abx changed to Linezolid, Aztreonam, Levaquin for treatment of sepsis. No cephalosporin administration found per review in Epic.  ID consulted 7/7, per discussion with Dr. Tommy Medal, ok to change Zyvox to Vancomycin as symptoms more consistent with PCN allergy than vancomycin and patient did not notice them himself.  Assessment/Plan: - Continue Vancomycin 1250mg  IV q12h. VT goal = 15-20 mcg/ml. -Scr improved from this am 1.07>0.82 -f/u scr/additional levels as needed  Temp (24hrs), Avg:97.7 F (36.5 C), Min:97.5 F (36.4 C), Max:97.9 F (36.6 C)   Recent Labs Lab 09/19/15 0416 09/20/15 0625  WBC 9.5 11.3*     Recent Labs Lab 09/19/15 0416 09/20/15 0625 09/21/15 2101  CREATININE 0.72 1.07 0.82   Estimated Creatinine Clearance: 109.9 mL/min (by C-G formula based on Cr of 0.82).    Allergies  Allergen Reactions  . Zosyn [Piperacillin Sod-Tazobactam So]     Lip swelling     Antimicrobials this admission: 7/6 Vancomcyin x 1 7/6 Zosyn x 1 7/6 Levoflox x 1 7/6 Aztreo x 1 7/6 Zyvox >> 7/7 7/7 Vanc >>  Levels/dose changes this admission: 7/8 2100 VT: 19  on 1250 mg q12h  Microbiology results: 7/6 BCx: MSSA per BCID 7/6 UCx: NGF 7/7 BCx: 1/2 GPC in clusters 7/7 HIV Ab:IP 7/7 Hep C Ab: IP  Thank you for allowing pharmacy to be a part of this patient's care.   Dorrene German 09/21/2015 10:02 PM

## 2015-09-21 NOTE — Op Note (Signed)
09/19/2015 - 09/21/2015  8:42 AM  PATIENT:  Martin Norton, 60 y.o., male, MRN: [id number]  PREOP DIAGNOSIS:  BACTEREMIA, probably infected power port  POSTOP DIAGNOSIS:   BACTEREMIA, probably infected power port  PROCEDURE:   Procedure(s): REMOVAL PORT-A-CATH  SURGEON:   Alphonsa Overall, M.D.   ANESTHESIA:   MAC  Anesthesiologist: Nolon Nations, MD CRNA: Gean Maidens, CRNA  Monitor Anesthesia Care  EBL:  none  ml  LOCAL MEDICATIONS USED:   10 cc 1% xylocaine  SPECIMEN:   Cultures of cath tip obtained  COUNTS CORRECT:  YES  INDICATIONS FOR PROCEDURE:  Martin Norton is a 60 y.o. (DOB: [birth year]) white  male whose primary care physician is SELTZER, Desiree Hane, MD and comes for power port removal.   The patient has lymphoma is treated at [facility] by Dr. Cristino Martes.  He presented with sepsis and MSSA bacteremia.  The port is considered the probable source of infection.  He now comes for removal of his left subclavian power port.    The indications and risks of the surgery were explained to the patient.  The risks include, but are not limited to, infection, bleeding, and nerve injury.  PROCEDURE:  The patient was taken to room #1 at [place].    He is already on Vancomycin.   A time out was held and the surgical checklist run.   I prepped his left upper chest with chloroprep and then sterilely draped the left upper chest.  I infiltrated about 10 cc of 1% xylocaine around the port.  I made an incision through the old scar and removed the power port intact.   I cut off the tip of the power port and put it in a culture tube.  The wound was irrigated.  There was no gross evidence of infection and the port capsule looked normal.   I closed the wound with interrupted 3-0 vicryl and used 4-0 monocryl in the skin.  The wound was painted with LiquidBand.   He was transferred to the recovery room in good condition.  Alphonsa Overall, MD, [facility] Surgery Pager: [phone] Office phone:   [phone]

## 2015-09-21 NOTE — Progress Notes (Signed)
PROGRESS NOTE    Martin Norton  [id number] DOB: 11/17/1955 DOA: 09/19/2015 PCP: Red Christians, MD   Brief Narrative:  60 y.o. 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 He report his pain is severe, pain medications not helping at home. Has history of diffuse large cell lymphoma.    Assessment & Plan:   Active Problems:     Septic shock (HCC) - Continue IV antibiotic regimen  MSSA bacteremia -ID on board and managing antibiotic regimen. - Continue supportive therapy - Consulted general surgery for removal of port. Has been completed today.  - decreasing decadron frequency in lieu of active infection  Right thigh pain - suspect it is secondary to diffuse large B cell lymphoma - Ultrasound of lower extremities negative for deep vein thrombosis.    Diffuse large B-cell lymphoma (HCC) - We'll have patient follow-up with oncologist    Arterial hypotension   Lactic acid acidosis - Likely secondary to sepsis    Hypotension - Improved with IV rehydration   Staphylococcus aureus bacteremia with sepsis (HCC)    Leg pain, right   Pleural effusion  DVT prophylaxis: We will continue on heparin Code Status: DO NOT RESUSCITATE Family Communication: Discussed directly with patient Disposition Plan: Pending improvement in condition  Consultants:   General surgery  ID  Palliative care  Procedures: None   Antimicrobials:  Vancomycin  Subjective: Pt has no new complaints. No acute issues overnight. He wanted to remove supplemental oxygen after the procedure as it was bothering him.  Objective: Filed Vitals:   09/21/15 0930 09/21/15 0931 09/21/15 0945 09/21/15 1200  BP: 131/74  140/86   Pulse:  74    Temp: 97.6 F (36.4 C)  97.6 F  (36.4 C)   TempSrc:      Resp: 18 21 18 20   Height:      Weight:      SpO2:  92% 99% 100%    Intake/Output Summary (Last 24 hours) at 09/21/15 1329 Last data filed at 09/21/15 0930  Gross per 24 hour  Intake   5060 ml  Output   2355 ml  Net   2705 ml   Filed Weights   09/19/15 0937 09/19/15 1601  Weight: 86.183 kg (190 lb) 92.2 kg (203 lb 4.2 oz)    Examination:  General exam: Appears calm and comfortable  Respiratory system: Clear to auscultation. Respiratory effort normal. Cardiovascular system: S1 & S2 heard, RRR. No rubs Gastrointestinal system: Abdomen is nondistended, soft and nontender. No organomegaly or masses felt. Normal bowel sounds heard. Central nervous system: Alert and oriented. No focal neurological deficits. Extremities: Symmetric 5 x 5 power. Skin: No rashes, lesions or ulcers Psychiatry: Judgement and insight appear normal. Mood & affect appropriate.   Data Reviewed: I have personally reviewed following labs and imaging studies  CBC:  Recent Labs Lab 09/19/15 0416 09/20/15 0625  WBC 9.5 11.3*  NEUTROABS 9.2*  --   HGB 10.2* 9.2*  HCT 29.4* 26.5*  MCV 88.6 89.5  PLT 194 [id number]*   Basic Metabolic Panel:  Recent Labs Lab 09/19/15 0416 09/20/15 0625  NA 129* 130*  K 3.6 4.6  CL 94* 101  CO2 23 21*  GLUCOSE 72 128*  BUN 27* 32*  CREATININE 0.72 1.07  CALCIUM 9.9 8.4*  GFR: Estimated Creatinine Clearance: 84.2 mL/min (by C-G formula based on Cr of 1.07). Liver Function Tests:  Recent Labs Lab 09/19/15 0422  AST 31  ALT 25  ALKPHOS 42  BILITOT 0.6  PROT 6.2*  ALBUMIN 3.6   No results for input(s): LIPASE, AMYLASE in the last 168 hours. No results for input(s): AMMONIA in the last 168 hours. Coagulation Profile: No results for input(s): INR, PROTIME in the last 168 hours. Cardiac Enzymes: No results for input(s): CKTOTAL, CKMB, CKMBINDEX, TROPONINI in the last 168 hours. BNP (last 3 results) No results for input(s): PROBNP  in the last 8760 hours. HbA1C: No results for input(s): HGBA1C in the last 72 hours. CBG:  Recent Labs Lab 09/21/15 0859  GLUCAP 160*   Lipid Profile: No results for input(s): CHOL, HDL, LDLCALC, TRIG, CHOLHDL, LDLDIRECT in the last 72 hours. Thyroid Function Tests: No results for input(s): TSH, T4TOTAL, FREET4, T3FREE, THYROIDAB in the last 72 hours. Anemia Panel: No results for input(s): VITAMINB12, FOLATE, FERRITIN, TIBC, IRON, RETICCTPCT in the last 72 hours. Sepsis Labs:  Recent Labs Lab 09/19/15 0847 09/19/15 1410  LATICACIDVEN 4.51* 3.35*    Recent Results (from the past 240 hour(s))  Blood culture (routine x 2)     Status: Abnormal (Preliminary result)   Collection Time: 09/19/15  8:40 AM  Result Value Ref Range Status   Specimen Description BLOOD LEFT ANTECUBITAL  Final   Special Requests BOTTLES DRAWN AEROBIC AND ANAEROBIC 5ML  Final   Culture  Setup Time   Final    GRAM POSITIVE COCCI IN CLUSTERS IN BOTH AEROBIC AND ANAEROBIC BOTTLES CRITICAL RESULT CALLED TO, READ BACK BY AND VERIFIED WITH: PHARM L POWELL AT 2159 [id number] [place]    Culture (A)  Final    STAPHYLOCOCCUS AUREUS SUSCEPTIBILITIES TO FOLLOW Performed at [facility]    Report Status PENDING  Incomplete  Blood Culture ID Panel (Reflexed)     Status: Abnormal   Collection Time: 09/19/15  8:40 AM  Result Value Ref Range Status   Enterococcus species NOT DETECTED NOT DETECTED Final   Vancomycin resistance NOT DETECTED NOT DETECTED Final   Listeria monocytogenes NOT DETECTED NOT DETECTED Final   Staphylococcus species DETECTED (A) NOT DETECTED Final    Comment: CRITICAL RESULT CALLED TO, READ BACK BY AND VERIFIED WITH: PHARM L POWELL AT 2159 [id number] MARTINB    Staphylococcus aureus DETECTED (A) NOT DETECTED Final    Comment: CRITICAL RESULT CALLED TO, READ BACK BY AND VERIFIED WITH: PHARM L POWELL AT 2159 [id number] MARTINB    Methicillin resistance NOT DETECTED NOT DETECTED Final    Streptococcus species NOT DETECTED NOT DETECTED Final   Streptococcus agalactiae NOT DETECTED NOT DETECTED Final   Streptococcus pneumoniae NOT DETECTED NOT DETECTED Final   Streptococcus pyogenes NOT DETECTED NOT DETECTED Final   Acinetobacter baumannii NOT DETECTED NOT DETECTED Final   Enterobacteriaceae species NOT DETECTED NOT DETECTED Final   Enterobacter cloacae complex NOT DETECTED NOT DETECTED Final   Escherichia coli NOT DETECTED NOT DETECTED Final   Klebsiella oxytoca NOT DETECTED NOT DETECTED Final   Klebsiella pneumoniae NOT DETECTED NOT DETECTED Final   Proteus species NOT DETECTED NOT DETECTED Final   Serratia marcescens NOT DETECTED NOT DETECTED Final   Carbapenem resistance NOT DETECTED NOT DETECTED Final   Haemophilus influenzae NOT DETECTED NOT DETECTED Final   Neisseria meningitidis NOT DETECTED NOT DETECTED Final   Pseudomonas aeruginosa NOT DETECTED NOT DETECTED Final   Candida albicans NOT DETECTED NOT DETECTED  Final   Candida glabrata NOT DETECTED NOT DETECTED Final   Candida krusei NOT DETECTED NOT DETECTED Final   Candida parapsilosis NOT DETECTED NOT DETECTED Final   Candida tropicalis NOT DETECTED NOT DETECTED Final  Blood culture (routine x 2)     Status: Abnormal (Preliminary result)   Collection Time: 09/19/15  9:10 AM  Result Value Ref Range Status   Specimen Description BLOOD LEFT HAND  Final   Special Requests IN PEDIATRIC BOTTLE 2ML  Final   Culture  Setup Time   Final    GRAM POSITIVE COCCI IN CLUSTERS AEROBIC BOTTLE ONLY CRITICAL RESULT CALLED TO, READ BACK BY AND VERIFIED WITHLavell Luster [facility] 2221 09/19/15 A BROWNING Performed at [facility] (A)  Final   Report Status PENDING  Incomplete  Urine culture     Status: None   Collection Time: 09/19/15 11:56 AM  Result Value Ref Range Status   Specimen Description URINE, CLEAN CATCH  Final   Special Requests NONE  Final   Culture NO GROWTH Performed at  [facility]   Final   Report Status 09/20/2015 FINAL  Final  Culture, blood (Routine X 2) w Reflex to ID Panel     Status: None (Preliminary result)   Collection Time: 09/20/15  9:15 AM  Result Value Ref Range Status   Specimen Description BLOOD LEFT HAND  Final   Special Requests IN PEDIATRIC BOTTLE 4CC  Final   Culture  Setup Time   Final    GRAM POSITIVE COCCI IN CLUSTERS AEROBIC BOTTLE ONLY CRITICAL RESULT CALLED TO, READ BACK BY AND VERIFIED WITH: A RUNYON 09/21/15 @ 0751 M VESTAL Performed at [place]  Final   Report Status PENDING  Incomplete  Culture, blood (Routine X 2) w Reflex to ID Panel     Status: None (Preliminary result)   Collection Time: 09/20/15  9:15 AM  Result Value Ref Range Status   Specimen Description BLOOD LEFT HAND  Final   Special Requests IN PEDIATRIC BOTTLE 3CC  Final   Culture  Setup Time   Final    GRAM POSITIVE COCCI IN CLUSTERS AEROBIC BOTTLE ONLY CRITICAL RESULT CALLED TO, READ BACK BY AND VERIFIED WITH: A RUNYON 09/21/15 @ 20 M VESTAL Performed at [place]  Final   Report Status PENDING  Incomplete         Radiology Studies: Mr Femur Right W Wo Contrast  09/20/2015  CLINICAL DATA:  Right femur pain for 18 months. History of lymphoma. EXAM: MRI OF THE RIGHT FEMUR WITHOUT AND WITH CONTRAST TECHNIQUE: Multiplanar, multisequence MR imaging of the lower right extremity was performed both before and after administration of intravenous contrast. CONTRAST:  82mL MULTIHANCE GADOBENATE DIMEGLUMINE 529 MG/ML IV SOLN COMPARISON:  None. FINDINGS: There is an enhancing 3.6 x 5.1 x 2.9 cm soft tissue mass in the right buttock deep to and possibly involving the right gluteus maximus muscle immediately adjacent to the right sciatic nerve. The patient has extensive patchy edema throughout muscles of both thighs, more prominent on the right than the left. There is asymmetric  increased edema of the right adductor muscles of the right side of the pelvis. The femur appears normal. No discrete hip effusion. Moderate bilateral knee effusions. There is circumferential subcutaneous edema and both thighs and in the scrotum. IMPRESSION: 1. Soft tissue mass immediately adjacent to  the right sciatic nerve posterior to the right acetabulum and deep to the right gluteus maximus muscle consistent with lymphoma. 2. Asymmetric patchy extensive edema in the muscles of both thighs, right greater than left. This is consistent with nonspecific myositis. Electronically Signed   By: Lorriane Shire M.D.   On: 09/20/2015 15:04        Scheduled Meds: . dexamethasone  8 mg Intravenous Q8H  . famotidine (PEPCID) IV  20 mg Intravenous Q12H  . fentaNYL  50 mcg Transdermal Q72H  . fentaNYL   Intravenous Q4H  . heparin subcutaneous  5,000 Units Subcutaneous Q8H  . vancomycin  1,250 mg Intravenous Q12H   Continuous Infusions: . sodium chloride 100 mL/hr at 09/21/15 0340    Time spent: > 35 minutes  Velvet Bathe, MD [facility] Pager [phone]  If 7PM-7AM, please contact night-coverage [email or web address] Password [email or web address] 09/21/2015, 1:29 PM

## 2015-09-21 NOTE — Progress Notes (Signed)
Pt with probable port infection. Seen by ID, Dr. Tommy Medal.  For port removal today.  Discussed with patient.  His lymphoma is treated at [facility] by Dr. Cristino Martes.  He has bilateral lower extremity lymphedema.  Indication and risks of surgery explained to patient.  Port sits in upper left chest - left subclavian.  No redness or swelling around port.  He wants to be on a trial at [facility] - [facility] - but is not there yet.  For porta cath removal this AM.  Alphonsa Overall, MD, [facility] Surgery Pager: [phone] Office phone:  [phone]

## 2015-09-21 NOTE — Progress Notes (Signed)
Pharmacy Antibiotic Follow-up Note  Martin Norton is a 60 y.o. year-old male admitted on 09/19/2015.  One dose of Vancomycin & Zosyn given in ED on 7/6, for probable sepsis. Swelling of upper lip noted by girlfriend, attributed to abx, unable to distinguish if Vancomycin or Zosyn.  Abx changed to Linezolid, Aztreonam, Levaquin for treatment of sepsis. No cephalosporin administration found per review in Epic.  ID consulted 7/7, per discussion with Dr. Tommy Medal, ok to change Zyvox to Vancomycin as symptoms more consistent with PCN allergy than vancomycin and patient did not notice them himself.  Assessment/Plan: - Continue Vancomycin 1250mg  IV q12h. Will check VT tonight, VT goal = 15-20 mcg/ml. Will obtain SCr as well tonight. - F/u SCr, cultures.   Temp (24hrs), Avg:97.9 F (36.6 C), Min:97.6 F (36.4 C), Max:98.3 F (36.8 C)   Recent Labs Lab 09/19/15 0416 09/20/15 0625  WBC 9.5 11.3*     Recent Labs Lab 09/19/15 0416 09/20/15 0625  CREATININE 0.72 1.07   Estimated Creatinine Clearance: 84.2 mL/min (by C-G formula based on Cr of 1.07).    Allergies  Allergen Reactions  . Zosyn [Piperacillin Sod-Tazobactam So]     Lip swelling     Antimicrobials this admission: 7/6 Vancomcyin x 1 7/6 Zosyn x 1 7/6 Levoflox x 1 7/6 Aztreo x 1 7/6 Zyvox >> 7/7 7/7 Vanc >>  Levels/dose changes this admission: 7/8 2100 VT: ___ on 1250 mg q12h  Microbiology results: 7/6 BCx: MSSA per BCID 7/6 UCx: NGF 7/7 BCx: 1/2 GPC in clusters 7/7 HIV Ab:IP 7/7 Hep C Ab: IP  Thank you for allowing pharmacy to be a part of this patient's care.  Hershal Coria, PharmD, BCPS Pager: [phone] 09/21/2015 7:44 AM

## 2015-09-21 NOTE — Progress Notes (Signed)
I stopped by briefly to check on Martin Norton as he returned from having Port-A-Cath removed.  He denies any needs today, he reports pain still remains well controlled.  Plan to follow-up to check on him for symptom management tomorrow. Please let me know if there is anything else I can do to assist in his care in the interim.  Micheline Rough, MD [facility] [phone]

## 2015-09-22 LAB — CULTURE, BLOOD (ROUTINE X 2)

## 2015-09-22 LAB — CBC AND DIFFERENTIAL
HCT: 27 % — AB (ref 41–53)
Hemoglobin: 8.8 g/dL — AB (ref 13.5–17.5)
PLATELETS: 133 10*3/uL — AB (ref 150–399)
WBC: 8.2 10^3/mL

## 2015-09-22 LAB — BASIC METABOLIC PANEL
ANION GAP: 6 (ref 5–15)
BUN: 28 mg/dL — AB (ref 4–21)
BUN: 28 mg/dL — AB (ref 6–20)
CALCIUM: 8.9 mg/dL (ref 8.9–10.3)
CO2: 23 mmol/L (ref 22–32)
Chloride: 101 mmol/L (ref 101–111)
Creatinine, Ser: 0.73 mg/dL (ref 0.61–1.24)
Creatinine: 0.7 mg/dL (ref 0.6–1.3)
GFR calc Af Amer: >60 mL/min (ref >60)
GFR calc non Af Amer: >60 mL/min (ref >60)
GLUCOSE: 191 mg/dL — AB (ref 65–99)
Glucose: 191 mg/dL
POTASSIUM: 4.2 mmol/L (ref 3.5–5.1)
Potassium: 4.2 mmol/L (ref 3.4–5.3)
Sodium: 130 mmol/L — AB (ref 137–147)
Sodium: 130 mmol/L — ABNORMAL LOW (ref 135–145)

## 2015-09-22 LAB — CBC
HEMATOCRIT: 25.6 % — AB (ref 39.0–52.0)
Hemoglobin: 8.8 g/dL — ABNORMAL LOW (ref 13.0–17.0)
MCH: 30 pg (ref 26.0–34.0)
MCHC: 34.4 g/dL (ref 30.0–36.0)
MCV: 87.4 fL (ref 78.0–100.0)
Platelets: 133 10*3/uL — ABNORMAL LOW (ref 150–400)
RBC: 2.93 MIL/uL — ABNORMAL LOW (ref 4.22–5.81)
RDW: 15.3 % (ref 11.5–15.5)
WBC: 8.2 10*3/uL (ref 4.0–10.5)

## 2015-09-22 MED ORDER — DEXAMETHASONE SODIUM PHOSPHATE 10 MG/ML IJ SOLN
8.0000 mg | Freq: Two times a day (BID) | INTRAMUSCULAR | Status: DC
  Administered 2015-09-23 – 2015-09-25 (×5): 8 mg via INTRAVENOUS
  Filled 2015-09-22 (×5): qty 1

## 2015-09-22 MED ORDER — DEXAMETHASONE SODIUM PHOSPHATE 10 MG/ML IJ SOLN: 8.0000 mg | Freq: Two times a day (BID) | INTRAMUSCULAR | Status: DC

## 2015-09-22 NOTE — Progress Notes (Addendum)
Daily Progress Note   Patient Name: Martin Norton       Date: 09/22/2015 DOB: [birth year]  Age: 60 y.o. MRN#: [id number] Attending Physician: Sallyanne Havers, MD Primary Care Physician: Red Christians, MD Admit Date: 09/19/2015  Reason for Consultation/Follow-up: Establishing goals of care and Pain control  Subjective: I met today with Martin Norton.    He reports feeling well overall today, but that he has been having some increased confusion.  His girlfriend, [name], also called and reported to me that she was talking with him earlier and he was unsure if it was morning or night and was confused that she was in the room with him when they were actually talking on the phone.  Length of Stay: 2  Current Medications: Scheduled Meds:  . [START ON 09/23/2015] dexamethasone  8 mg Intravenous Q12H  . famotidine (PEPCID) IV  20 mg Intravenous Q12H  . fentaNYL  50 mcg Transdermal Q72H  . fentaNYL   Intravenous Q4H  . heparin subcutaneous  5,000 Units Subcutaneous Q8H  . vancomycin  1,250 mg Intravenous Q12H    Continuous Infusions: . sodium chloride 100 mL/hr at 09/22/15 0538    PRN Meds: acetaminophen **OR** acetaminophen, diphenhydrAMINE **OR** diphenhydrAMINE, HYDROcodone-acetaminophen, ondansetron (ZOFRAN) IV, ondansetron **OR** ondansetron (ZOFRAN) IV  Physical Exam         Eyes: PERRL, lids and conjunctivae normal ENMT: Mucous membranes are dry . Posterior pharynx clear of any exudate or lesions.Normal dentition. Upper lip with swelling.  Neck: normal, supple, no masses, no thyromegaly Respiratory: clear to auscultation bilaterally, no wheezing, no crackles. Normal respiratory effort. No accessory muscle use.  Cardiovascular: Regular rate and rhythm, no murmurs / rubs / gallops.No carotid  bruits.  Abdomen: no tenderness, no masses palpated. No hepatosplenomegaly. Bowel sounds positive.  Musculoskeletal: no clubbing / cyanosis.  Bilateral LE edema worse left  Skin: no rashes, lesions, ulcers. No induration Psychiatric: Normal judgment and insight. Alert and conversational.  Vital Signs: BP 146/89 mmHg  Pulse 101  Temp(Src) 97.9 F (36.6 C) (Oral)  Resp 18  Ht _0  (1.803 m)  Wt 92.2 kg (203 lb 4.2 oz)  BMI 28.36 kg/m2  SpO2 100% SpO2: SpO2: 100 % O2 Device: O2 Device: Not Delivered O2 Flow Rate: O2 Flow Rate (L/min): 0 L/min  Intake/output summary:   Intake/Output  Summary (Last 24 hours) at 09/22/15 1932 Last data filed at 09/22/15 1746  Gross per 24 hour  Intake    480 ml  Output   1700 ml  Net  -1220 ml   LBM: Last BM Date: 09/18/15 Baseline Weight: Weight: 86.183 kg (190 lb) Most recent weight: Weight: 92.2 kg (203 lb 4.2 oz)       Palliative Assessment/Data: 30%     Patient Active Problem List   Diagnosis Date Noted  . Palliative care encounter   . Goals of care, counseling/discussion   . Staphylococcus aureus bacteremia with sepsis ([place])   . Septic shock ([place])   . Leg pain, right   . Pleural effusion   . Infected venous access port   . Hypotension 09/19/2015  . Right thigh pain   . Diffuse large B-cell lymphoma ([place])   . Cancer associated pain   . Arterial hypotension   . Lactic acid acidosis     Palliative Care Assessment & Plan   Recommendations/Plan:  - Goal remains to treat treatable conditions while understanding that he has progressing deterioration from cancer.  He would like to continue antibiotics, but is clear that he does not want to escalate care (No pressors, intubation, CPR) in event of decompensation. - Pain: Improved.  He has not used PCA to this point.  I discussed with bedside nurse and pharmacy.  Would continue for now, but when current fentanyl syringe expires, would d/c PCA in favor of intermittent pushes if  his need remain consistently low.  Current syringe due to expire 7/15.  Continue fentanyl patch at 49mg/hr.  I think that his pain is better controlled primarily due to escalation of steroids.  - Confusion: likely multifactorial.  I doubt related to pain medication, but think that steroids are possibly contributing.  Continue taper steroids to lowest effective dose.  Decreased to 822mBID. - Constipation: opioid related.  Senna daily.  Code Status:    Code Status Orders        Start     Ordered   09/19/15 1609  Do not attempt resuscitation (DNR)   Continuous    Question Answer Comment  In the event of cardiac or respiratory ARREST Do not call a "code blue"   In the event of cardiac or respiratory ARREST Do not perform Intubation, CPR, defibrillation or ACLS   In the event of cardiac or respiratory ARREST Use medication by any route, position, wound care, and other measures to relive pain and suffering. May use oxygen, suction and manual treatment of airway obstruction as needed for comfort.      09/19/15 1608    Code Status History    Date Active Date Inactive Code Status Order ID Comments User Context   09/19/2015 12:37 PM 09/19/2015  4:08 PM DNR [id number] BrooksNP ED       Prognosis:  Unable to determine, but he remains at high risk for continued decompensation and death  Discharge Planning:  To Be Determined  Care plan was discussed with patient, long term girlfriend, and brother  Thank you for allowing the Palliative Medicine Team to assist in the care of this patient.   Time In: 1920 Time Out: 1940 Total Time 20 Prolonged Time Billed No      Greater than 50%  of this time was spent counseling and coordinating care related to the above assessment and plan.  GeMicheline RoughMD  Please contact Palliative Medicine Team phone at [phone]  questions and concerns.

## 2015-09-22 NOTE — Progress Notes (Signed)
PROGRESS NOTE    Martin Norton  [id number] DOB: Dec 19, 1955 DOA: 09/19/2015 PCP: Red Christians, MD   Brief Narrative:  60 y.o. male with medical history significant of of DM II, HTN, refractory diffuse large B-Cell Lymphoma treated at [facility], has had extensive treatment for lymphoma, he presents today with worsening legs pain, he has chronic lymphedema lower extremities from malignancy, and chronic pain. He report his pain is severe, pain medications not helping at home. Has history of diffuse large cell lymphoma.    Assessment & Plan:   Active Problems:     Septic shock (HCC) - Continue IV antibiotic regimen  MSSA bacteremia -ID on board and managing antibiotic regimen. - Continue supportive therapy - Consulted general surgery for removal of port. Has been completed today.  - decreasing decadron frequency in lieu of active infection - Pt will need TEE soon. Will contact cardiology next am.   Right thigh pain - suspect it is secondary to diffuse large B cell lymphoma - Ultrasound of lower extremities negative for deep vein thrombosis.    Diffuse large B-cell lymphoma (HCC) - We'll have patient follow-up with oncologist    Arterial hypotension   Lactic acid acidosis - Likely secondary to sepsis    Hypotension - Improved with IV rehydration   Staphylococcus aureus bacteremia with sepsis (HCC)    Leg pain, right   Pleural effusion  DVT prophylaxis: We will continue on heparin Code Status: DO NOT RESUSCITATE Family Communication: Discussed directly with patient Disposition Plan: Pending improvement in condition and work up  Consultants:   General surgery  ID  Palliative care  Procedures: None   Antimicrobials:  Vancomycin  Subjective: Pt has no new complaints. He has felt more confused today due to pain medication but he states it is improving.   Objective: Filed Vitals:   09/21/15 1752 09/22/15 0509 09/22/15 0729 09/22/15 1200  BP: 152/93 151/89     Pulse: 73 76    Temp: 97.6 F (36.4 C) 97.6 F (36.4 C)    TempSrc: Oral Oral    Resp: 16 18 16 16   Height:      Weight:      SpO2: 100% 100% 100% 100%    Intake/Output Summary (Last 24 hours) at 09/22/15 1328 Last data filed at 09/22/15 0930  Gross per 24 hour  Intake    360 ml  Output   2650 ml  Net  -2290 ml   Filed Weights   09/19/15 0937 09/19/15 1601  Weight: 86.183 kg (190 lb) 92.2 kg (203 lb 4.2 oz)    Examination:  General exam: Appears calm and comfortable  Respiratory system: Clear to auscultation. Respiratory effort normal. Cardiovascular system: S1 & S2 heard, RRR. No rubs Gastrointestinal system: Abdomen is nondistended, soft and nontender. No organomegaly or masses felt. Normal bowel sounds heard. Central nervous system: Alert and oriented. No focal neurological deficits. Extremities: Symmetric 5 x 5 power. Skin: No rashes, lesions or ulcers Psychiatry: Judgement and insight appear normal. Mood & affect appropriate.   Data Reviewed: I have personally reviewed following labs and imaging studies  CBC:  Recent Labs Lab 09/19/15 0416 09/20/15 0625 09/22/15 0416  WBC 9.5 11.3* 8.2  NEUTROABS 9.2*  --   --   HGB 10.2* 9.2* 8.8*  HCT 29.4* 26.5* 25.6*  MCV 88.6 89.5 87.4  PLT 194 135* [id number]*   Basic Metabolic Panel:  Recent Labs Lab 09/19/15 0416 09/20/15 0625 09/21/15 2101 09/22/15 0416  NA 129* 130*  --  130*  K 3.6 4.6  --  4.2  CL 94* 101  --  101  CO2 23 21*  --  23  GLUCOSE 72 128*  --  191*  BUN 27* 32*  --  28*  CREATININE 0.72 1.07 0.82 0.73  CALCIUM 9.9 8.4*  --  8.9   GFR: Estimated Creatinine Clearance: 112.6 mL/min (by C-G formula based on Cr of 0.73). Liver Function Tests:  Recent Labs Lab 09/19/15 0422  AST 31  ALT 25  ALKPHOS 42  BILITOT 0.6  PROT 6.2*  ALBUMIN 3.6   No results for input(s): LIPASE, AMYLASE in the last 168 hours. No results for input(s): AMMONIA in the last 168 hours. Coagulation Profile: No  results for input(s): INR, PROTIME in the last 168 hours. Cardiac Enzymes: No results for input(s): CKTOTAL, CKMB, CKMBINDEX, TROPONINI in the last 168 hours. BNP (last 3 results) No results for input(s): PROBNP in the last 8760 hours. HbA1C: No results for input(s): HGBA1C in the last 72 hours. CBG:  Recent Labs Lab 09/21/15 0859  GLUCAP 160*   Lipid Profile: No results for input(s): CHOL, HDL, LDLCALC, TRIG, CHOLHDL, LDLDIRECT in the last 72 hours. Thyroid Function Tests: No results for input(s): TSH, T4TOTAL, FREET4, T3FREE, THYROIDAB in the last 72 hours. Anemia Panel: No results for input(s): VITAMINB12, FOLATE, FERRITIN, TIBC, IRON, RETICCTPCT in the last 72 hours. Sepsis Labs:  Recent Labs Lab 09/19/15 0847 09/19/15 1410  LATICACIDVEN 4.51* 3.35*    Recent Results (from the past 240 hour(s))  Blood culture (routine x 2)     Status: Abnormal   Collection Time: 09/19/15  8:40 AM  Result Value Ref Range Status   Specimen Description BLOOD LEFT ANTECUBITAL  Final   Special Requests BOTTLES DRAWN AEROBIC AND ANAEROBIC 5ML  Final   Culture  Setup Time   Final    GRAM POSITIVE COCCI IN CLUSTERS IN BOTH AEROBIC AND ANAEROBIC BOTTLES CRITICAL RESULT CALLED TO, READ BACK BY AND VERIFIED WITH: PHARM L POWELL AT 2159 [id number] Justin Performed at [place] (A)  Final   Report Status 09/22/2015 FINAL  Final   Organism ID, Bacteria STAPHYLOCOCCUS AUREUS  Final      Susceptibility   Staphylococcus aureus - MIC*    CIPROFLOXACIN <=0.5 SENSITIVE Sensitive     ERYTHROMYCIN 4 RESISTANT Resistant     GENTAMICIN <=0.5 SENSITIVE Sensitive     OXACILLIN 0.5 SENSITIVE Sensitive     TETRACYCLINE <=1 SENSITIVE Sensitive     VANCOMYCIN <=0.5 SENSITIVE Sensitive     TRIMETH/SULFA <=10 SENSITIVE Sensitive     CLINDAMYCIN <=0.25 RESISTANT Resistant     RIFAMPIN <=0.5 SENSITIVE Sensitive     Inducible Clindamycin POSITIVE Resistant     *  STAPHYLOCOCCUS AUREUS  Blood Culture ID Panel (Reflexed)     Status: Abnormal   Collection Time: 09/19/15  8:40 AM  Result Value Ref Range Status   Enterococcus species NOT DETECTED NOT DETECTED Final   Vancomycin resistance NOT DETECTED NOT DETECTED Final   Listeria monocytogenes NOT DETECTED NOT DETECTED Final   Staphylococcus species DETECTED (A) NOT DETECTED Final    Comment: CRITICAL RESULT CALLED TO, READ BACK BY AND VERIFIED WITH: PHARM L POWELL AT 2159 [id number] MARTINB    Staphylococcus aureus DETECTED (A) NOT DETECTED Final    Comment: CRITICAL RESULT CALLED TO, READ BACK BY AND VERIFIED WITH: PHARM L POWELL AT 2159 [id number] MARTINB    Methicillin resistance NOT DETECTED  NOT DETECTED Final   Streptococcus species NOT DETECTED NOT DETECTED Final   Streptococcus agalactiae NOT DETECTED NOT DETECTED Final   Streptococcus pneumoniae NOT DETECTED NOT DETECTED Final   Streptococcus pyogenes NOT DETECTED NOT DETECTED Final   Acinetobacter baumannii NOT DETECTED NOT DETECTED Final   Enterobacteriaceae species NOT DETECTED NOT DETECTED Final   Enterobacter cloacae complex NOT DETECTED NOT DETECTED Final   Escherichia coli NOT DETECTED NOT DETECTED Final   Klebsiella oxytoca NOT DETECTED NOT DETECTED Final   Klebsiella pneumoniae NOT DETECTED NOT DETECTED Final   Proteus species NOT DETECTED NOT DETECTED Final   Serratia marcescens NOT DETECTED NOT DETECTED Final   Carbapenem resistance NOT DETECTED NOT DETECTED Final   Haemophilus influenzae NOT DETECTED NOT DETECTED Final   Neisseria meningitidis NOT DETECTED NOT DETECTED Final   Pseudomonas aeruginosa NOT DETECTED NOT DETECTED Final   Candida albicans NOT DETECTED NOT DETECTED Final   Candida glabrata NOT DETECTED NOT DETECTED Final   Candida krusei NOT DETECTED NOT DETECTED Final   Candida parapsilosis NOT DETECTED NOT DETECTED Final   Candida tropicalis NOT DETECTED NOT DETECTED Final  Blood culture (routine x 2)     Status:  Abnormal   Collection Time: 09/19/15  9:10 AM  Result Value Ref Range Status   Specimen Description BLOOD LEFT HAND  Final   Special Requests IN PEDIATRIC BOTTLE 2ML  Final   Culture  Setup Time   Final    GRAM POSITIVE COCCI IN CLUSTERS AEROBIC BOTTLE ONLY CRITICAL RESULT CALLED TO, READ BACK BY AND VERIFIED WITH: Lavell Luster [facility] 2221 09/19/15 A BROWNING    Culture (A)  Final    STAPHYLOCOCCUS AUREUS SUSCEPTIBILITIES PERFORMED ON PREVIOUS CULTURE WITHIN THE LAST 5 DAYS. Performed at [facility]    Report Status 09/22/2015 FINAL  Final  Urine culture     Status: None   Collection Time: 09/19/15 11:56 AM  Result Value Ref Range Status   Specimen Description URINE, CLEAN CATCH  Final   Special Requests NONE  Final   Culture NO GROWTH Performed at [facility]   Final   Report Status 09/20/2015 FINAL  Final  Culture, blood (Routine X 2) w Reflex to ID Panel     Status: Abnormal   Collection Time: 09/20/15  9:15 AM  Result Value Ref Range Status   Specimen Description BLOOD LEFT HAND  Final   Special Requests IN PEDIATRIC BOTTLE 4CC  Final   Culture  Setup Time   Final    GRAM POSITIVE COCCI IN CLUSTERS AEROBIC BOTTLE ONLY CRITICAL RESULT CALLED TO, READ BACK BY AND VERIFIED WITH: A RUNYON 09/21/15 @ 0751 M VESTAL    Culture (A)  Final    STAPHYLOCOCCUS AUREUS SUSCEPTIBILITIES PERFORMED ON PREVIOUS CULTURE WITHIN THE LAST 5 DAYS. Performed at [facility]    Report Status 09/22/2015 FINAL  Final  Culture, blood (Routine X 2) w Reflex to ID Panel     Status: Abnormal   Collection Time: 09/20/15  9:15 AM  Result Value Ref Range Status   Specimen Description BLOOD LEFT HAND  Final   Special Requests IN PEDIATRIC BOTTLE 3CC  Final   Culture  Setup Time   Final    GRAM POSITIVE COCCI IN CLUSTERS AEROBIC BOTTLE ONLY CRITICAL RESULT CALLED TO, READ BACK BY AND VERIFIED WITH: A RUNYON 09/21/15 @ 0751 M VESTAL    Culture (A)  Final    STAPHYLOCOCCUS  AUREUS SUSCEPTIBILITIES PERFORMED ON PREVIOUS CULTURE WITHIN  THE LAST 5 DAYS. Performed at [facility]    Report Status 09/22/2015 FINAL  Final  Surgical pcr screen     Status: Abnormal   Collection Time: 09/21/15  7:36 AM  Result Value Ref Range Status   MRSA, PCR NEGATIVE NEGATIVE Final   Staphylococcus aureus POSITIVE (A) NEGATIVE Final    Comment:        The Xpert SA Assay (FDA approved for NASAL specimens in patients over 90 years of age), is one component of a comprehensive surveillance program.  Test performance has been validated by [facility] for patients greater than or equal to 34 year old. It is not intended to diagnose infection nor to guide or monitor treatment.          Radiology Studies: Mr Femur Right W Wo Contrast  09/20/2015  CLINICAL DATA:  Right femur pain for 18 months. History of lymphoma. EXAM: MRI OF THE RIGHT FEMUR WITHOUT AND WITH CONTRAST TECHNIQUE: Multiplanar, multisequence MR imaging of the lower right extremity was performed both before and after administration of intravenous contrast. CONTRAST:  42mL MULTIHANCE GADOBENATE DIMEGLUMINE 529 MG/ML IV SOLN COMPARISON:  None. FINDINGS: There is an enhancing 3.6 x 5.1 x 2.9 cm soft tissue mass in the right buttock deep to and possibly involving the right gluteus maximus muscle immediately adjacent to the right sciatic nerve. The patient has extensive patchy edema throughout muscles of both thighs, more prominent on the right than the left. There is asymmetric increased edema of the right adductor muscles of the right side of the pelvis. The femur appears normal. No discrete hip effusion. Moderate bilateral knee effusions. There is circumferential subcutaneous edema and both thighs and in the scrotum. IMPRESSION: 1. Soft tissue mass immediately adjacent to the right sciatic nerve posterior to the right acetabulum and deep to the right gluteus maximus muscle consistent with lymphoma. 2. Asymmetric patchy  extensive edema in the muscles of both thighs, right greater than left. This is consistent with nonspecific myositis. Electronically Signed   By: Lorriane Shire M.D.   On: 09/20/2015 15:04        Scheduled Meds: . dexamethasone  8 mg Intravenous Q8H  . famotidine (PEPCID) IV  20 mg Intravenous Q12H  . fentaNYL  50 mcg Transdermal Q72H  . fentaNYL   Intravenous Q4H  . heparin subcutaneous  5,000 Units Subcutaneous Q8H  . vancomycin  1,250 mg Intravenous Q12H   Continuous Infusions: . sodium chloride 100 mL/hr at 09/22/15 0538    Time spent: > 35 minutes  Velvet Bathe, MD [facility] Pager [phone]  If 7PM-7AM, please contact night-coverage [email or web address] Password [email or web address] 09/22/2015, 1:28 PM

## 2015-09-23 ENCOUNTER — Encounter (HOSPITAL_COMMUNITY): Payer: Self-pay | Admitting: Surgery

## 2015-09-23 DIAGNOSIS — Y718 Miscellaneous cardiovascular devices associated with adverse incidents, not elsewhere classified: Secondary | ICD-10-CM

## 2015-09-23 DIAGNOSIS — F05 Delirium due to known physiological condition: Secondary | ICD-10-CM | POA: Diagnosis present

## 2015-09-23 DIAGNOSIS — G893 Neoplasm related pain (acute) (chronic): Secondary | ICD-10-CM | POA: Diagnosis present

## 2015-09-23 DIAGNOSIS — Z880 Allergy status to penicillin: Secondary | ICD-10-CM

## 2015-09-23 DIAGNOSIS — R0989 Other specified symptoms and signs involving the circulatory and respiratory systems: Secondary | ICD-10-CM

## 2015-09-23 LAB — HEPATITIS C ANTIBODY (REFLEX): HCV Ab: <0.1 s/co ratio (ref 0.0–0.9)

## 2015-09-23 LAB — ANAEROBIC CULTURE: Culture: UNDETERMINED

## 2015-09-23 LAB — HCV COMMENT:

## 2015-09-23 MED ORDER — FENTANYL CITRATE (PF) 100 MCG/2ML IJ SOLN: 25.0000 ug | INTRAMUSCULAR | Status: DC | PRN

## 2015-09-23 NOTE — Progress Notes (Signed)
PROGRESS NOTE    Martin Norton  [id number] DOB: [birth year] DOA: 09/19/2015 PCP: Martin Christians, MD   Brief Narrative:  60 y.o. male with medical history significant of of DM II, HTN, refractory diffuse large B-Cell Lymphoma treated at [facility], has had extensive treatment for lymphoma, he presents today with worsening legs pain, he has chronic lymphedema lower extremities from malignancy, and chronic pain. He report his pain is severe, pain medications not helping at home. Has history of diffuse large cell lymphoma.   Assessment & Plan:   Active Problems:     Septic shock (HCC) - IV antibiotics per ID - sepsis physiology resolved.   MSSA bacteremia -ID on board and managing antibiotic regimen. - Continue supportive therapy - Consulted general surgery for removal of port. Has been completed today.  - decreasing decadron frequency in lieu of active infection - Cardiology contacted for TEE. Plans are for TEE evaluation next a.m. 09/24/2015  Right thigh pain - suspect it is secondary to diffuse large B cell lymphoma - Ultrasound of lower extremities negative for deep vein thrombosis.    Diffuse large B-cell lymphoma (HCC) - We'll have patient follow-up with oncologist    Arterial hypotension   Lactic acid acidosis - Likely secondary to sepsis    Hypotension - Improved with IV rehydration   Staphylococcus aureus bacteremia with sepsis (HCC)    Leg pain, right   Pleural effusion  DVT prophylaxis: We will continue on heparin Code Status: DO NOT RESUSCITATE Family Communication: Discussed directly with patient Disposition Plan: Pending improvement in condition and work up  Consultants:   General surgery  ID  Palliative care  Procedures: None   Antimicrobials:  Vancomycin  Subjective: Pt has no new complaints. He has felt more confused today due to pain medication but he states it is improving.   Objective: Filed Vitals:   09/23/15 0734 09/23/15 1141 09/23/15  1400 09/23/15 1554  BP:   162/84   Pulse:   84   Temp:   98.7 F (37.1 C)   TempSrc:   Oral   Resp: 18 16 18 16   Height:      Weight:      SpO2: 100% 100% 90% 100%    Intake/Output Summary (Last 24 hours) at 09/23/15 1608 Last data filed at 09/23/15 1400  Gross per 24 hour  Intake    240 ml  Output   1870 ml  Net  -1630 ml   Filed Weights   09/19/15 0937 09/19/15 1601  Weight: 86.183 kg (190 lb) 92.2 kg (203 lb 4.2 oz)    Examination:  General exam: Appears calm and comfortable  Respiratory system: Clear to auscultation. Respiratory effort normal. Cardiovascular system: S1 & S2 heard, RRR. No rubs Gastrointestinal system: Abdomen is nondistended, soft and nontender. No organomegaly or masses felt. Normal bowel sounds heard. Central nervous system: Alert and oriented. No focal neurological deficits. Extremities: Symmetric 5 x 5 power. Skin: No rashes, lesions or ulcers Psychiatry: Judgement and insight appear normal. Mood & affect appropriate.   Data Reviewed: I have personally reviewed following labs and imaging studies  CBC:  Recent Labs Lab 09/19/15 0416 09/20/15 0625 09/22/15 0416  WBC 9.5 11.3* 8.2  NEUTROABS 9.2*  --   --   HGB 10.2* 9.2* 8.8*  HCT 29.4* 26.5* 25.6*  MCV 88.6 89.5 87.4  PLT 194 135* [id number]*   Basic Metabolic Panel:  Recent Labs Lab 09/19/15 0416 09/20/15 0625 09/21/15 2101 09/22/15 0416  NA 129* 130*  --  130*  K 3.6 4.6  --  4.2  CL 94* 101  --  101  CO2 23 21*  --  23  GLUCOSE 72 128*  --  191*  BUN 27* 32*  --  28*  CREATININE 0.72 1.07 0.82 0.73  CALCIUM 9.9 8.4*  --  8.9   GFR: Estimated Creatinine Clearance: 112.6 mL/min (by C-G formula based on Cr of 0.73). Liver Function Tests:  Recent Labs Lab 09/19/15 0422  AST 31  ALT 25  ALKPHOS 42  BILITOT 0.6  PROT 6.2*  ALBUMIN 3.6   No results for input(s): LIPASE, AMYLASE in the last 168 hours. No results for input(s): AMMONIA in the last 168 hours. Coagulation  Profile: No results for input(s): INR, PROTIME in the last 168 hours. Cardiac Enzymes: No results for input(s): CKTOTAL, CKMB, CKMBINDEX, TROPONINI in the last 168 hours. BNP (last 3 results) No results for input(s): PROBNP in the last 8760 hours. HbA1C: No results for input(s): HGBA1C in the last 72 hours. CBG:  Recent Labs Lab 09/21/15 0859  GLUCAP 160*   Lipid Profile: No results for input(s): CHOL, HDL, LDLCALC, TRIG, CHOLHDL, LDLDIRECT in the last 72 hours. Thyroid Function Tests: No results for input(s): TSH, T4TOTAL, FREET4, T3FREE, THYROIDAB in the last 72 hours. Anemia Panel: No results for input(s): VITAMINB12, FOLATE, FERRITIN, TIBC, IRON, RETICCTPCT in the last 72 hours. Sepsis Labs:  Recent Labs Lab 09/19/15 0847 09/19/15 1410  LATICACIDVEN 4.51* 3.35*    Recent Results (from the past 240 hour(s))  Blood culture (routine x 2)     Status: Abnormal   Collection Time: 09/19/15  8:40 AM  Result Value Ref Range Status   Specimen Description BLOOD LEFT ANTECUBITAL  Final   Special Requests BOTTLES DRAWN AEROBIC AND ANAEROBIC 5ML  Final   Culture  Setup Time   Final    GRAM POSITIVE COCCI IN CLUSTERS IN BOTH AEROBIC AND ANAEROBIC BOTTLES CRITICAL RESULT CALLED TO, READ BACK BY AND VERIFIED WITH: PHARM L POWELL AT 2159 [id number] [place] Performed at [facility] (A)  Final   Report Status 09/22/2015 FINAL  Final   Organism ID, Bacteria STAPHYLOCOCCUS AUREUS  Final      Susceptibility   Staphylococcus aureus - MIC*    CIPROFLOXACIN <=0.5 SENSITIVE Sensitive     ERYTHROMYCIN 4 RESISTANT Resistant     GENTAMICIN <=0.5 SENSITIVE Sensitive     OXACILLIN 0.5 SENSITIVE Sensitive     TETRACYCLINE <=1 SENSITIVE Sensitive     VANCOMYCIN <=0.5 SENSITIVE Sensitive     TRIMETH/SULFA <=10 SENSITIVE Sensitive     CLINDAMYCIN <=0.25 RESISTANT Resistant     RIFAMPIN <=0.5 SENSITIVE Sensitive     Inducible Clindamycin POSITIVE  Resistant     * STAPHYLOCOCCUS AUREUS  Blood Culture ID Panel (Reflexed)     Status: Abnormal   Collection Time: 09/19/15  8:40 AM  Result Value Ref Range Status   Enterococcus species NOT DETECTED NOT DETECTED Final   Vancomycin resistance NOT DETECTED NOT DETECTED Final   Listeria monocytogenes NOT DETECTED NOT DETECTED Final   Staphylococcus species DETECTED (A) NOT DETECTED Final    Comment: CRITICAL RESULT CALLED TO, READ BACK BY AND VERIFIED WITH: PHARM L POWELL AT 2159 [id number] MARTINB    Staphylococcus aureus DETECTED (A) NOT DETECTED Final    Comment: CRITICAL RESULT CALLED TO, READ BACK BY AND VERIFIED WITH: PHARM L POWELL AT 2159 [id number] MARTINB    Methicillin resistance NOT DETECTED  NOT DETECTED Final   Streptococcus species NOT DETECTED NOT DETECTED Final   Streptococcus agalactiae NOT DETECTED NOT DETECTED Final   Streptococcus pneumoniae NOT DETECTED NOT DETECTED Final   Streptococcus pyogenes NOT DETECTED NOT DETECTED Final   Acinetobacter baumannii NOT DETECTED NOT DETECTED Final   Enterobacteriaceae species NOT DETECTED NOT DETECTED Final   Enterobacter cloacae complex NOT DETECTED NOT DETECTED Final   Escherichia coli NOT DETECTED NOT DETECTED Final   Klebsiella oxytoca NOT DETECTED NOT DETECTED Final   Klebsiella pneumoniae NOT DETECTED NOT DETECTED Final   Proteus species NOT DETECTED NOT DETECTED Final   Serratia marcescens NOT DETECTED NOT DETECTED Final   Carbapenem resistance NOT DETECTED NOT DETECTED Final   Haemophilus influenzae NOT DETECTED NOT DETECTED Final   Neisseria meningitidis NOT DETECTED NOT DETECTED Final   Pseudomonas aeruginosa NOT DETECTED NOT DETECTED Final   Candida albicans NOT DETECTED NOT DETECTED Final   Candida glabrata NOT DETECTED NOT DETECTED Final   Candida krusei NOT DETECTED NOT DETECTED Final   Candida parapsilosis NOT DETECTED NOT DETECTED Final   Candida tropicalis NOT DETECTED NOT DETECTED Final  Blood culture (routine  x 2)     Status: Abnormal   Collection Time: 09/19/15  9:10 AM  Result Value Ref Range Status   Specimen Description BLOOD LEFT HAND  Final   Special Requests IN PEDIATRIC BOTTLE 2ML  Final   Culture  Setup Time   Final    GRAM POSITIVE COCCI IN CLUSTERS AEROBIC BOTTLE ONLY CRITICAL RESULT CALLED TO, READ BACK BY AND VERIFIED WITH: Lavell Luster [facility] 2221 09/19/15 A BROWNING    Culture (A)  Final    STAPHYLOCOCCUS AUREUS SUSCEPTIBILITIES PERFORMED ON PREVIOUS CULTURE WITHIN THE LAST 5 DAYS. Performed at [facility]    Report Status 09/22/2015 FINAL  Final  Urine culture     Status: None   Collection Time: 09/19/15 11:56 AM  Result Value Ref Range Status   Specimen Description URINE, CLEAN CATCH  Final   Special Requests NONE  Final   Culture NO GROWTH Performed at [facility]   Final   Report Status 09/20/2015 FINAL  Final  Culture, blood (Routine X 2) w Reflex to ID Panel     Status: Abnormal   Collection Time: 09/20/15  9:15 AM  Result Value Ref Range Status   Specimen Description BLOOD LEFT HAND  Final   Special Requests IN PEDIATRIC BOTTLE 4CC  Final   Culture  Setup Time   Final    GRAM POSITIVE COCCI IN CLUSTERS AEROBIC BOTTLE ONLY CRITICAL RESULT CALLED TO, READ BACK BY AND VERIFIED WITH: A RUNYON 09/21/15 @ 0751 M VESTAL    Culture (A)  Final    STAPHYLOCOCCUS AUREUS SUSCEPTIBILITIES PERFORMED ON PREVIOUS CULTURE WITHIN THE LAST 5 DAYS. Performed at [facility]    Report Status 09/22/2015 FINAL  Final  Culture, blood (Routine X 2) w Reflex to ID Panel     Status: Abnormal   Collection Time: 09/20/15  9:15 AM  Result Value Ref Range Status   Specimen Description BLOOD LEFT HAND  Final   Special Requests IN PEDIATRIC BOTTLE 3CC  Final   Culture  Setup Time   Final    GRAM POSITIVE COCCI IN CLUSTERS AEROBIC BOTTLE ONLY CRITICAL RESULT CALLED TO, READ BACK BY AND VERIFIED WITH: A RUNYON 09/21/15 @ 0751 M VESTAL    Culture (A)  Final     STAPHYLOCOCCUS AUREUS SUSCEPTIBILITIES PERFORMED ON PREVIOUS CULTURE WITHIN  THE LAST 5 DAYS. Performed at [facility]    Report Status 09/22/2015 FINAL  Final  Anaerobic culture     Status: None   Collection Time: 09/21/15  7:30 AM  Result Value Ref Range Status   Specimen Description PORTA CATH  Final   Special Requests NONE  Final   Culture   Final    SPECIMEN/CONTAINER TYPE INAPPROPRIATE FOR ORDERED TEST, UNABLE TO Arlis Porta, RN AT 1103 09/23/15 BY L BENFIELD Performed at [facility]    Report Status 09/23/2015 FINAL  Final  Surgical pcr screen     Status: Abnormal   Collection Time: 09/21/15  7:36 AM  Result Value Ref Range Status   MRSA, PCR NEGATIVE NEGATIVE Final   Staphylococcus aureus POSITIVE (A) NEGATIVE Final    Comment:        The Xpert SA Assay (FDA approved for NASAL specimens in patients over 1 years of age), is one component of a comprehensive surveillance program.  Test performance has been validated by [facility] for patients greater than or equal to 77 year old. It is not intended to diagnose infection nor to guide or monitor treatment.   Culture, blood (routine x 2)     Status: None (Preliminary result)   Collection Time: 09/21/15  2:36 PM  Result Value Ref Range Status   Specimen Description BLOOD RIGHT ANTECUBITAL  Final   Special Requests BOTTLES DRAWN AEROBIC AND ANAEROBIC 10CC  Final   Culture   Final    NO GROWTH 2 DAYS Performed at [facility]    Report Status PENDING  Incomplete  Culture, blood (routine x 2)     Status: None (Preliminary result)   Collection Time: 09/21/15  2:36 PM  Result Value Ref Range Status   Specimen Description BLOOD RIGHT HAND  Final   Special Requests BOTTLES DRAWN AEROBIC ONLY [place]  Final   Culture   Final    NO GROWTH 2 DAYS Performed at [facility]    Report Status PENDING  Incomplete     Radiology Studies: No results found.   Scheduled Meds: . dexamethasone  8  mg Intravenous Q12H  . famotidine (PEPCID) IV  20 mg Intravenous Q12H  . fentaNYL  50 mcg Transdermal Q72H  . fentaNYL   Intravenous Q4H  . heparin subcutaneous  5,000 Units Subcutaneous Q8H  . vancomycin  1,250 mg Intravenous Q12H   Continuous Infusions: . sodium chloride 100 mL/hr at 09/22/15 0538    Time spent: > 35 minutes  Velvet Bathe, MD [facility] Pager [phone]  If 7PM-7AM, please contact night-coverage [email or web address] Password [email or web address] 09/23/2015, 4:08 PM

## 2015-09-23 NOTE — Progress Notes (Signed)
Daily Progress Note   Patient Name: Martin Norton       Date: 09/23/2015 DOB: 09-20-1955  Age: 60 y.o. MRN#: [id number] Attending Physician: Velvet Bathe, MD Primary Care Physician: Red Christians, MD Admit Date: 09/19/2015  Reason for Consultation/Follow-up: Establishing goals of care and Pain control  Subjective: I met today with Martin Norton.    He reports feeling well overall today, but continues to report having some increased confusion.  He is pleasant and oriented during our conversation.  States when he wakes up, he is unsure if AM or PM. Nursing reports that he has been having periods of confusion where he is not sure where he is or what is going on.  Length of Stay: 3  Current Medications: Scheduled Meds:  . dexamethasone  8 mg Intravenous Q12H  . famotidine (PEPCID) IV  20 mg Intravenous Q12H  . fentaNYL  50 mcg Transdermal Q72H  . heparin subcutaneous  5,000 Units Subcutaneous Q8H  . vancomycin  1,250 mg Intravenous Q12H    Continuous Infusions: . sodium chloride 100 mL/hr at 09/22/15 0538    PRN Meds: acetaminophen **OR** acetaminophen, fentaNYL (SUBLIMAZE) injection, HYDROcodone-acetaminophen, ondansetron **OR** ondansetron (ZOFRAN) IV  Physical Exam         Eyes: PERRL, lids and conjunctivae normal ENMT: Mucous membranes are dry . Posterior pharynx clear of any exudate or lesions.Normal dentition. Upper lip with swelling.  Neck: normal, supple, no masses, no thyromegaly Respiratory: clear to auscultation bilaterally, no wheezing, no crackles. Normal respiratory effort. No accessory muscle use.  Cardiovascular: Regular rate and rhythm, no murmurs / rubs / gallops.No carotid bruits.  Abdomen: no tenderness, no masses palpated. No hepatosplenomegaly. Bowel sounds  positive.  Musculoskeletal: no clubbing / cyanosis.  Bilateral LE edema worse left  Skin: no rashes, lesions, ulcers. No induration Psychiatric: Normal judgment and insight. Alert and conversational.  Vital Signs: BP 162/84 mmHg  Pulse 84  Temp(Src) 98.7 F (37.1 C) (Oral)  Resp 16  Ht 5' 11" (1.803 m)  Wt 92.2 kg (203 lb 4.2 oz)  BMI 28.36 kg/m2  SpO2 100% SpO2: SpO2: 100 % O2 Device: O2 Device: Not Delivered O2 Flow Rate: O2 Flow Rate (L/min): 0 L/min  Intake/output summary:   Intake/Output Summary (Last 24 hours) at 09/23/15 1814 Last data filed at 09/23/15 1400  Gross  per 24 hour  Intake    120 ml  Output   1870 ml  Net  -1750 ml   LBM: Last BM Date: 09/23/15 Baseline Weight: Weight: 86.183 kg (190 lb) Most recent weight: Weight: 92.2 kg (203 lb 4.2 oz)       Palliative Assessment/Data: 30%     Patient Active Problem List   Diagnosis Date Noted  . Sepsis ([place])   . Palliative care encounter   . Goals of care, counseling/discussion   . Staphylococcus aureus bacteremia with sepsis ([place])   . Septic shock ([place])   . Leg pain, right   . Pleural effusion   . Infected venous access port   . Hypotension 09/19/2015  . Right thigh pain   . 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 Cancer associated pain   . Arterial hypotension   . Lactic acid acidosis     Palliative Care Assessment & Plan   Recommendations/Plan:  - Goal remains to treat treatable conditions while understanding that he has progressing deterioration from cancer.  He would like to continue antibiotics, but is clear that he does not want to escalate care (No pressors, intubation, CPR) in event of decompensation. - Pain: Improved.  He has not used PCA to this point. Will transition to bolus fentanyl as needed. - Confusion: likely multifactorial.  I doubt related to pain medication, but think that steroids are possibly contributing.  Continue taper steroids to lowest effective dose.  I also d/c PCA to  lessen amount of time needed to be attached to IV. Recommend continue follow delirium recs below as well:  Standard delirium management (adapted from NICE guidelines 2011 for prevention of delirium):  Provide continuity of care when possible (avoid frequent changing of surroundings and staff).  Frequent reorientation to time with:  A clock should always be visible.  Make sure Calendar/white board is updated.  Lights on/blinds open during the day and off/closed at night.  Encourage frequent family visits.  Monitor for and treat dehydration/constipation.  Optimize oxygen saturation.  Avoid catheters and IV's when possible and look for/treat infections.  Encourage early mobility.  Assess and treat pain.  Ensure adequate nutrition and functioning dentures.  Address reversible causes of hearing and visual impairment:  Use pocket talker if hearing aids are unavailable.  Avoid sleep disturbance (normalize sleep/wake cycle).  Minimize disturbances and consider NOT obtaining vitals at night if possible.  Review Medications to avoid polypharmacy and avoid deliriogenic medications when possible:  Benzodiazepines.  Dihydropyridines.  Antihistamines.  Anticholinergics.  (Possibly avoid: H2 blockers, tricyclic antidepressants, antiparkinson medications, steroids, NSAID's).   - Constipation: opioid related.  BM yesterday per pt.  Senna daily.  Code Status:    Code Status Orders        Start     Ordered   09/19/15 1609  Do not attempt resuscitation (DNR)   Continuous    Question Answer Comment  In the event of cardiac or respiratory ARREST Do not call a "code blue"   In the event of cardiac or respiratory ARREST Do not perform Intubation, CPR, defibrillation or ACLS   In the event of cardiac or respiratory ARREST Use medication by any route, position, wound care, and other measures to relive pain and suffering. May use oxygen, suction and manual treatment of airway obstruction as needed for  comfort.      09/19/15 1608    Code Status History    Date Active Date Inactive Code Status Order ID Comments User  Context   09/19/2015 12:37 PM 09/19/2015  4:08 PM DNR [id number]  Donita Brooks, NP ED       Prognosis:  Unable to determine, but he remains at high risk for continued decompensation and death  Discharge Planning:  To Be Determined  Care plan was discussed with patient  Thank you for allowing the Palliative Medicine Team to assist in the care of this patient.   Time In: 1115 Time Out: 1140 Total Time 25 Prolonged Time Billed No      Greater than 50%  of this time was spent counseling and coordinating care related to the above assessment and plan.  Micheline Rough, MD  Please contact Palliative Medicine Team phone at [phone] for questions and concerns.

## 2015-09-23 NOTE — Progress Notes (Signed)
INFECTIOUS DISEASE PROGRESS NOTE  ID: Martin Norton is a 60 y.o. male with  Active Problems:   Right thigh pain   Diffuse large B-cell lymphoma (HCC)   Arterial hypotension   Lactic acid acidosis   Hypotension   Staphylococcus aureus bacteremia with sepsis (HCC)   Septic shock (HCC)   Leg pain, right   Pleural effusion   Infected venous access port   Palliative care encounter   Goals of care, counseling/discussion   Sepsis ([place])  Subjective: Without complaints.   Abtx:  Anti-infectives    Start     Dose/Rate Route Frequency Ordered Stop   09/20/15 1000  vancomycin (VANCOCIN) 1,250 mg in sodium chloride 0.9 % 250 mL IVPB     1,250 mg 166.7 mL/hr over 90 Minutes Intravenous Every 12 hours 09/20/15 0929     09/19/15 2359  aztreonam (AZACTAM) 1 g in dextrose 5 % 50 mL IVPB  Status:  Discontinued     1 g 100 mL/hr over 30 Minutes Intravenous Every 8 hours 09/19/15 1445 09/19/15 2233   09/19/15 2200  linezolid (ZYVOX) IVPB 600 mg  Status:  Discontinued     600 mg 300 mL/hr over 60 Minutes Intravenous Every 12 hours 09/19/15 1419 09/20/15 0841   09/19/15 1800  vancomycin (VANCOCIN) IVPB 1000 mg/200 mL premix  Status:  Discontinued     1,000 mg 200 mL/hr over 60 Minutes Intravenous Every 12 hours 09/19/15 1046 09/19/15 1347   09/19/15 1800  piperacillin-tazobactam (ZOSYN) IVPB 3.375 g  Status:  Discontinued     3.375 g 12.5 mL/hr over 240 Minutes Intravenous Every 8 hours 09/19/15 1046 09/19/15 1347   09/19/15 1800  levofloxacin (LEVAQUIN) IVPB 750 mg  Status:  Discontinued     750 mg 100 mL/hr over 90 Minutes Intravenous Every 24 hours 09/19/15 1446 09/19/15 2233   09/19/15 1600  aztreonam (AZACTAM) 2 g in dextrose 5 % 50 mL IVPB     2 g 100 mL/hr over 30 Minutes Intravenous  Once 09/19/15 1445 09/19/15 1815   09/19/15 0900  piperacillin-tazobactam (ZOSYN) IVPB 3.375 g     3.375 g 100 mL/hr over 30 Minutes Intravenous  Once 09/19/15 0855 09/19/15 1014   09/19/15 0900   vancomycin (VANCOCIN) IVPB 1000 mg/200 mL premix     1,000 mg 200 mL/hr over 60 Minutes Intravenous  Once 09/19/15 0855 09/19/15 1112      Medications:  Scheduled: . dexamethasone  8 mg Intravenous Q12H  . famotidine (PEPCID) IV  20 mg Intravenous Q12H  . fentaNYL  50 mcg Transdermal Q72H  . fentaNYL   Intravenous Q4H  . heparin subcutaneous  5,000 Units Subcutaneous Q8H  . vancomycin  1,250 mg Intravenous Q12H    Objective: Vital signs in last 24 hours: Temp:  [97.5 F (36.4 C)-98.7 F (37.1 C)] 98.7 F (37.1 C) (07/10 1400) Pulse Rate:  [77-85] 84 (07/10 1400) Resp:  [16-18] 18 (07/10 1400) BP: (152-177)/(84-91) 162/84 mmHg (07/10 1400) SpO2:  [90 %-100 %] 90 % (07/10 1400) FiO2 (%):  [21 %] 21 % (07/10 1141)   General appearance: alert, cooperative and no distress Resp: clear to auscultation bilaterally Chest wall: no tenderness, L sided wound healing well.  Cardio: regular rate and rhythm GI: normal findings: bowel sounds normal and soft, non-tender Extremities: edema anasarca  Lab Results  Recent Labs  09/21/15 2101 09/22/15 0416  WBC  --  8.2  HGB  --  8.8*  HCT  --  25.6*  NA  --  130*  K  --  4.2  CL  --  101  CO2  --  23  BUN  --  28*  CREATININE 0.82 0.73   Liver Panel No results for input(s): PROT, ALBUMIN, AST, ALT, ALKPHOS, BILITOT, BILIDIR, IBILI in the last 72 hours. Sedimentation Rate  Recent Labs  09/21/15 0331  ESRSEDRATE 102*   C-Reactive Protein  Recent Labs  09/21/15 0331  CRP 19.1*    Microbiology: Recent Results (from the past 240 hour(s))  Blood culture (routine x 2)     Status: Abnormal   Collection Time: 09/19/15  8:40 AM  Result Value Ref Range Status   Specimen Description BLOOD LEFT ANTECUBITAL  Final   Special Requests BOTTLES DRAWN AEROBIC AND ANAEROBIC 5ML  Final   Culture  Setup Time   Final    GRAM POSITIVE COCCI IN CLUSTERS IN BOTH AEROBIC AND ANAEROBIC BOTTLES CRITICAL RESULT CALLED TO, READ BACK BY AND  VERIFIED WITH: PHARM L POWELL AT 2159 [id number] [place] Performed at [facility] (A)  Final   Report Status 09/22/2015 FINAL  Final   Organism ID, Bacteria STAPHYLOCOCCUS AUREUS  Final      Susceptibility   Staphylococcus aureus - MIC*    CIPROFLOXACIN <=0.5 SENSITIVE Sensitive     ERYTHROMYCIN 4 RESISTANT Resistant     GENTAMICIN <=0.5 SENSITIVE Sensitive     OXACILLIN 0.5 SENSITIVE Sensitive     TETRACYCLINE <=1 SENSITIVE Sensitive     VANCOMYCIN <=0.5 SENSITIVE Sensitive     TRIMETH/SULFA <=10 SENSITIVE Sensitive     CLINDAMYCIN <=0.25 RESISTANT Resistant     RIFAMPIN <=0.5 SENSITIVE Sensitive     Inducible Clindamycin POSITIVE Resistant     * STAPHYLOCOCCUS AUREUS  Blood Culture ID Panel (Reflexed)     Status: Abnormal   Collection Time: 09/19/15  8:40 AM  Result Value Ref Range Status   Enterococcus species NOT DETECTED NOT DETECTED Final   Vancomycin resistance NOT DETECTED NOT DETECTED Final   Listeria monocytogenes NOT DETECTED NOT DETECTED Final   Staphylococcus species DETECTED (A) NOT DETECTED Final    Comment: CRITICAL RESULT CALLED TO, READ BACK BY AND VERIFIED WITH: PHARM L POWELL AT 2159 [id number] MARTINB    Staphylococcus aureus DETECTED (A) NOT DETECTED Final    Comment: CRITICAL RESULT CALLED TO, READ BACK BY AND VERIFIED WITH: PHARM L POWELL AT 2159 [id number] MARTINB    Methicillin resistance NOT DETECTED NOT DETECTED Final   Streptococcus species NOT DETECTED NOT DETECTED Final   Streptococcus agalactiae NOT DETECTED NOT DETECTED Final   Streptococcus pneumoniae NOT DETECTED NOT DETECTED Final   Streptococcus pyogenes NOT DETECTED NOT DETECTED Final   Acinetobacter baumannii NOT DETECTED NOT DETECTED Final   Enterobacteriaceae species NOT DETECTED NOT DETECTED Final   Enterobacter cloacae complex NOT DETECTED NOT DETECTED Final   Escherichia coli NOT DETECTED NOT DETECTED Final   Klebsiella oxytoca NOT DETECTED NOT  DETECTED Final   Klebsiella pneumoniae NOT DETECTED NOT DETECTED Final   Proteus species NOT DETECTED NOT DETECTED Final   Serratia marcescens NOT DETECTED NOT DETECTED Final   Carbapenem resistance NOT DETECTED NOT DETECTED Final   Haemophilus influenzae NOT DETECTED NOT DETECTED Final   Neisseria meningitidis NOT DETECTED NOT DETECTED Final   Pseudomonas aeruginosa NOT DETECTED NOT DETECTED Final   Candida albicans NOT DETECTED NOT DETECTED Final   Candida glabrata NOT DETECTED NOT DETECTED Final   Candida krusei NOT DETECTED NOT DETECTED Final  Candida parapsilosis NOT DETECTED NOT DETECTED Final   Candida tropicalis NOT DETECTED NOT DETECTED Final  Blood culture (routine x 2)     Status: Abnormal   Collection Time: 09/19/15  9:10 AM  Result Value Ref Range Status   Specimen Description BLOOD LEFT HAND  Final   Special Requests IN PEDIATRIC BOTTLE 2ML  Final   Culture  Setup Time   Final    GRAM POSITIVE COCCI IN CLUSTERS AEROBIC BOTTLE ONLY CRITICAL RESULT CALLED TO, READ BACK BY AND VERIFIED WITH: Lavell Luster [facility] 2221 09/19/15 A BROWNING    Culture (A)  Final    STAPHYLOCOCCUS AUREUS SUSCEPTIBILITIES PERFORMED ON PREVIOUS CULTURE WITHIN THE LAST 5 DAYS. Performed at [facility]    Report Status 09/22/2015 FINAL  Final  Urine culture     Status: None   Collection Time: 09/19/15 11:56 AM  Result Value Ref Range Status   Specimen Description URINE, CLEAN CATCH  Final   Special Requests NONE  Final   Culture NO GROWTH Performed at [facility]   Final   Report Status 09/20/2015 FINAL  Final  Culture, blood (Routine X 2) w Reflex to ID Panel     Status: Abnormal   Collection Time: 09/20/15  9:15 AM  Result Value Ref Range Status   Specimen Description BLOOD LEFT HAND  Final   Special Requests IN PEDIATRIC BOTTLE 4CC  Final   Culture  Setup Time   Final    GRAM POSITIVE COCCI IN CLUSTERS AEROBIC BOTTLE ONLY CRITICAL RESULT CALLED TO, READ BACK BY AND  VERIFIED WITH: A RUNYON 09/21/15 @ 0751 M VESTAL    Culture (A)  Final    STAPHYLOCOCCUS AUREUS SUSCEPTIBILITIES PERFORMED ON PREVIOUS CULTURE WITHIN THE LAST 5 DAYS. Performed at [facility]    Report Status 09/22/2015 FINAL  Final  Culture, blood (Routine X 2) w Reflex to ID Panel     Status: Abnormal   Collection Time: 09/20/15  9:15 AM  Result Value Ref Range Status   Specimen Description BLOOD LEFT HAND  Final   Special Requests IN PEDIATRIC BOTTLE 3CC  Final   Culture  Setup Time   Final    GRAM POSITIVE COCCI IN CLUSTERS AEROBIC BOTTLE ONLY CRITICAL RESULT CALLED TO, READ BACK BY AND VERIFIED WITH: A RUNYON 09/21/15 @ 0751 M VESTAL    Culture (A)  Final    STAPHYLOCOCCUS AUREUS SUSCEPTIBILITIES PERFORMED ON PREVIOUS CULTURE WITHIN THE LAST 5 DAYS. Performed at [facility]    Report Status 09/22/2015 FINAL  Final  Anaerobic culture     Status: None   Collection Time: 09/21/15  7:30 AM  Result Value Ref Range Status   Specimen Description PORTA CATH  Final   Special Requests NONE  Final   Culture   Final    SPECIMEN/CONTAINER TYPE INAPPROPRIATE FOR ORDERED TEST, UNABLE TO Arlis Porta, RN AT 1103 09/23/15 BY L BENFIELD Performed at [facility]    Report Status 09/23/2015 FINAL  Final  Surgical pcr screen     Status: Abnormal   Collection Time: 09/21/15  7:36 AM  Result Value Ref Range Status   MRSA, PCR NEGATIVE NEGATIVE Final   Staphylococcus aureus POSITIVE (A) NEGATIVE Final    Comment:        The Xpert SA Assay (FDA approved for NASAL specimens in patients over 18 years of age), is one component of a comprehensive surveillance program.  Test performance has been validated by   for patients greater than or equal to 41 year old. It is not intended to diagnose infection nor to guide or monitor treatment.   Culture, blood (routine x 2)     Status: None (Preliminary result)   Collection Time: 09/21/15  2:36 PM  Result Value  Ref Range Status   Specimen Description BLOOD RIGHT ANTECUBITAL  Final   Special Requests BOTTLES DRAWN AEROBIC AND ANAEROBIC 10CC  Final   Culture   Final    NO GROWTH 2 DAYS Performed at [facility]    Report Status PENDING  Incomplete  Culture, blood (routine x 2)     Status: None (Preliminary result)   Collection Time: 09/21/15  2:36 PM  Result Value Ref Range Status   Specimen Description BLOOD RIGHT HAND  Final   Special Requests BOTTLES DRAWN AEROBIC ONLY [place]  Final   Culture   Final    NO GROWTH 2 DAYS Performed at [facility]    Report Status PENDING  Incomplete    Studies/Results: No results found.   Assessment/Plan: MSSA bacteremia Porta-cath (removed) LV nodule Large Cell B Lymphoma PEN allergy  Total days of antibiotics: 3 vanco  Repeat BCx 7-8 ngtd He needs a TEE (per pt in AM) Will speak with pharm, he needs test dose of ancef (recent JAMA article provides excellent review of PEN allergy). He denies any recollection of a adverse reaction to any medication.           Bobby Rumpf Infectious Diseases (pager) [phone] [email or web address] 09/23/2015, 3:48 PM  LOS: 3 days

## 2015-09-23 NOTE — Progress Notes (Signed)
Patient scheduled for TEE at [facility] on 09/24/15 at 1000.  [facility] arranged to transport patient to [place] [facility] by 9 am.

## 2015-09-23 NOTE — Progress Notes (Signed)
2 Days Post-Op  Subjective: Stable and alert.  Afebrile.  Minimal pain at port removal site left infraclavicular area WBC yesterday 8200 Culture of port catheter tip pending but negative so far.  Objective: Vital signs in last 24 hours: Temp:  [97.5 F (36.4 C)-97.9 F (36.6 C)] 97.9 F (36.6 C) (07/10 0524) Pulse Rate:  [77-101] 77 (07/10 0524) Resp:  [16-18] 16 (07/10 0524) BP: (146-177)/(88-91) 177/91 mmHg (07/10 0524) SpO2:  [100 %] 100 % (07/10 0524) FiO2 (%):  [21 %] 21 % (07/09 1600) Last BM Date: 09/18/15  Intake/Output from previous day: 07/09 0701 - 07/10 0700 In: 480 [P.O.:480] Out: 1620 [Urine:1620] Intake/Output this shift: Total I/O In: -  Out: 1320 [Urine:1320]  General appearance: Alert.  No distress.  Cooperative. Chest wall: no tenderness, Port removal site left infraclavicular area looks good.  Tissue soft without signs of cellulitis or fluid collection.  Incision closed with Dermabond covering.  Lab Results:  No results found for this or any previous visit (from the past 24 hour(s)).   Studies/Results: No results found.  Marland Kitchen dexamethasone  8 mg Intravenous Q12H  . famotidine (PEPCID) IV  20 mg Intravenous Q12H  . fentaNYL  50 mcg Transdermal Q72H  . fentaNYL   Intravenous Q4H  . heparin subcutaneous  5,000 Units Subcutaneous Q8H  . vancomycin  1,250 mg Intravenous Q12H     Assessment/Plan: s/p Procedure(s): REMOVAL PORT-A-CATH  POD #2.  Removal of left infraclavicular port a cath. Wound healing uneventfully and no further wound care required Dermabond should wear off in about 3 weeks He may shower from our standpoint  MSSA bacteremia.  Power port removed 2 days ago as presumed source. Check cultures  Diffuse large cell B lymphoma DM-type II  hypertension Chronic lower extremity lymphedema  Gen. surgery will sign off at this point.  Please reconsult if surgical issues arise.    @PROBHOSP @  LOS: 3 days    Martin Norton  M 09/23/2015  . .prob

## 2015-09-23 NOTE — Anesthesia Postprocedure Evaluation (Signed)
Anesthesia Post Note  Patient: Optician, dispensing  Procedure(s) Performed: Procedure(s) (LRB): REMOVAL PORT-A-CATH (N/A)  Patient location during evaluation: PACU Anesthesia Type: MAC Level of consciousness: awake and alert Pain management: pain level controlled Vital Signs Assessment: post-procedure vital signs reviewed and stable Respiratory status: spontaneous breathing Cardiovascular status: stable Anesthetic complications: no    Last Vitals:  Filed Vitals:   09/23/15 0524 09/23/15 0734  BP: 177/91   Pulse: 77   Temp: 36.6 C   Resp: 16 18    Last Pain:  Filed Vitals:   09/23/15 0823  PainSc: 0-No pain                 Nolon Nations

## 2015-09-23 NOTE — Progress Notes (Signed)
    [facility] has been requested to perform a transesophageal echocardiogram on 7/10 for MSSA bacteremia.  After careful review of history and examination, the risks and benefits of transesophageal echocardiogram have been explained including risks of esophageal damage, perforation (1:10,000 risk), bleeding, pharyngeal hematoma as well as other potential complications associated with conscious sedation including aspiration, arrhythmia, respiratory failure and death. Alternatives to treatment were discussed, questions were answered. Patient is willing to proceed.   60 yo male with refractory large B cell lymphoma came in with MSSA sepsis. SBP now in hypertensive range. He did have some confusion yesterday, I have discussed with Dr. Wendee Beavers who think they are related to pain med, discussing with patient today, he is pleasant, no acute distress and A&Ox3. His port-a-cath has been removed 7/8, pending culture. TTE showed normal EF and density in LV possible chordae vs SBE. TEE recommended by ID.   His vital is stable, his hgb is down, but he denies any bleeding issue. I think it is related to hemodilution given how much IVF he has been receiving. He understand that during TEE his DNR status maybe temporarily reversed, he understand and agree to proceed.    Almyra Deforest, PA-C 09/23/2015 10:43 AM

## 2015-09-23 NOTE — Evaluation (Signed)
Physical Therapy Evaluation Patient Details Name: Martin Norton MRN: [id number] DOB: 04/13/1955 Today's Date: 09/23/2015   History of Present Illness  60 yo male admitted with septic shock, MSSA bacteremia, R thigh pain. Hx of DM, HTN, lymphoma, chronic lymphedema, chronic pain  Clinical Impression  On eval, pt required Mod assist +2 for mobility. Pt was able to take a few steps in room with RW. Significant edema in bil LEs, R > L. Bil weakness in bil LEs, R >L. Assist required to advance R LE during gait training due to impaired strength, coordination, and sensation. Recommend CIR consult for consideration for continued rehab. Pt is highly motivated to regain independence with functional mobility.     Follow Up Recommendations CIR    Equipment Recommendations   (to be determined)    Recommendations for Other Services OT consult;Rehab consult     Precautions / Restrictions Precautions Precautions: Fall Restrictions Weight Bearing Restrictions: No      Mobility  Bed Mobility Overal bed mobility: Needs Assistance Bed Mobility: Supine to Sit     Supine to sit: Mod assist;HOB elevated     General bed mobility comments: Assist for trunk and bil LE. Increased time.   Transfers Overall transfer level: Needs assistance Equipment used: Rolling walker (2 wheeled) Transfers: Sit to/from Stand Sit to Stand: Mod assist;+2 physical assistance;+2 safety/equipment;From elevated surface         General transfer comment: x2. Assist to rise, stabilize, control descent. Pt was allowed to pull up on walker.   Ambulation/Gait Ambulation/Gait assistance: Mod assist;+2 physical assistance;+2 safety/equipment Ambulation Distance (Feet): 5 Feet Assistive device: Rolling walker (2 wheeled) Gait Pattern/deviations: Step-to pattern;Decreased dorsiflexion - right;Decreased weight shift to right     General Gait Details: Assist to weight shift, advance and position R LE/foot , stabilize pt,  maneuver safely with RW. Followed closely with recliner with 3rd person.   Stairs            Wheelchair Mobility    Modified Rankin (Stroke Patients Only)       Balance Overall balance assessment: Needs assistance;History of Falls         Standing balance support: During functional activity Standing balance-Leahy Scale: Poor                               Pertinent Vitals/Pain Pain Assessment: Faces Faces Pain Scale: Hurts whole lot Pain Location: R LE, especially thigh during gait trainig Pain Descriptors / Indicators: Aching;Burning;Sore;Guarding;Grimacing Pain Intervention(s): Limited activity within patient's tolerance;Repositioned    Home Living Family/patient expects to be discharged to:: Private residence Living Arrangements: Spouse/significant other   Type of Home: House Home Access: Stairs to enter Entrance Stairs-Rails: Right Entrance Stairs-Number of Steps: 3 Home Layout: Multi-level Home Equipment: Cane - single point;Walker - standard Additional Comments: inflatable mattress on 1st level    Prior Function Level of Independence: Independent with assistive device(s)         Comments: using cane for ambulation prior to admission for ~2 weeks     Hand Dominance        Extremity/Trunk Assessment   Upper Extremity Assessment: Overall WFL for tasks assessed           Lower Extremity Assessment: RLE deficits/detail;LLE deficits/detail RLE Deficits / Details: hip flex 2/5, knee ext 2+/5, DF/PF 2/5 LLE Deficits / Details: generalized weakness  Cervical / Trunk Assessment: Normal  Communication   Communication: No difficulties  Cognition Arousal/Alertness:  Awake/alert Behavior During Therapy: WFL for tasks assessed/performed Overall Cognitive Status: Within Functional Limits for tasks assessed                      General Comments      Exercises General Exercises - Lower Extremity Ankle Circles/Pumps:  AROM;Both;10 reps;Seated Quad Sets: AAROM;Right;10 reps;Seated      Assessment/Plan    PT Assessment Patient needs continued PT services  PT Diagnosis Difficulty walking;Abnormality of gait;Generalized weakness;Acute pain   PT Problem List Decreased strength;Decreased range of motion;Decreased activity tolerance;Decreased balance;Decreased mobility;Decreased coordination;Decreased knowledge of use of DME;Pain;Impaired sensation  PT Treatment Interventions DME instruction;Gait training;Functional mobility training;Therapeutic activities;Patient/family education;Balance training   PT Goals (Current goals can be found in the Care Plan section) Acute Rehab PT Goals Patient Stated Goal: to regain strength and stop falling PT Goal Formulation: With patient/family Time For Goal Achievement: 10/07/15 Potential to Achieve Goals: Good    Frequency Min 3X/week   Barriers to discharge        Co-evaluation               End of Session Equipment Utilized During Treatment: Gait belt Activity Tolerance: Patient limited by fatigue;Patient limited by pain Patient left: in chair;with call bell/phone within reach;with family/visitor present           Time: [id number] PT Time Calculation (min) (ACUTE ONLY): 30 min   Charges:   PT Evaluation $PT Eval Moderate Complexity: 1 Procedure PT Treatments $Therapeutic Activity: 8-22 mins   PT G Codes:        [name], MPT Pager: [phone]

## 2015-09-23 NOTE — Progress Notes (Signed)
RN received call from lab regarding culture of PAC tip.  Per the lab the tip was placed in an anaerobic container and therefore cannot be cultured as the medium is incorrect.  Therefore no results can be generated on the PAC tip culture. MD paged to notify.

## 2015-09-23 NOTE — Progress Notes (Signed)
Rehab Admissions Coordinator Note:  Patient was screened by Cleatrice Burke for appropriateness for an Inpatient Acute Rehab Consult per PT recommendation.   At this time, we are recommending follow pt's progress with therapy and request an OT eval to assist in determining rehab venue needs. If pt would like to consider an intense inpt rehab venue, please place order for an inpt rehab consult and I will assess pt at the bedside with family input.Cleatrice Burke 09/23/2015, 4:42 PM  I can be reached at [phone].

## 2015-09-23 NOTE — Progress Notes (Signed)
Pt has become progressively more confused over the last 2 nights. He realizes his "memory is not 100%". He has been confused as to rather it is day or night, has pulled his condom cath off and wanted to pull his IV out, he thought his call bell was his cell phone. Has attempted to get out of bed a few times without assist. Bed alarm on for safety. MDs noted they were aware of confusion in yesterday's progress notes. Pt has not used any of his fentanyl PCA. Will continue to monitor. Hortencia Conradi RN.

## 2015-09-24 ENCOUNTER — Encounter (HOSPITAL_COMMUNITY)
Admission: EM | Disposition: A | Discharge status: Skilled Nursing Facility | Payer: Self-pay | Source: Home / Self Care | Attending: Family Medicine

## 2015-09-24 ENCOUNTER — Telehealth: Payer: Self-pay | Admitting: Infectious Diseases

## 2015-09-24 ENCOUNTER — Encounter (HOSPITAL_COMMUNITY): Payer: Self-pay

## 2015-09-24 ENCOUNTER — Inpatient Hospital Stay (HOSPITAL_COMMUNITY)
Admit: 2015-09-24 | Discharge: 2015-09-24 | Disposition: A | Discharge status: Home / Self Care | Payer: BLUE CROSS/BLUE SHIELD | Attending: Physician Assistant | Admitting: Physician Assistant

## 2015-09-24 DIAGNOSIS — I340 Nonrheumatic mitral (valve) insufficiency: Secondary | ICD-10-CM

## 2015-09-24 HISTORY — PX: TEE WITHOUT CARDIOVERSION: SHX5443

## 2015-09-24 LAB — CBC AND DIFFERENTIAL: WBC: 10.5 10^3/mL

## 2015-09-24 LAB — BASIC METABOLIC PANEL
BUN: 26 mg/dL — AB (ref 4–21)
Creatinine: 0.8 mg/dL (ref 0.6–1.3)
Glucose: 201 mg/dL
Potassium: 4.5 mmol/L (ref 3.4–5.3)
Sodium: 131 mmol/L — AB (ref 137–147)

## 2015-09-24 LAB — CBC
HCT: 28.5 % — ABNORMAL LOW (ref 39.0–52.0)
Hemoglobin: 9.5 g/dL — ABNORMAL LOW (ref 13.0–17.0)
MCH: 29.8 pg (ref 26.0–34.0)
MCHC: 33.3 g/dL (ref 30.0–36.0)
MCV: 89.3 fL (ref 78.0–100.0)
PLATELETS: 162 10*3/uL (ref 150–400)
RBC: 3.19 MIL/uL — ABNORMAL LOW (ref 4.22–5.81)
RDW: 15.1 % (ref 11.5–15.5)
WBC: 10.5 10*3/uL (ref 4.0–10.5)

## 2015-09-24 LAB — COMPREHENSIVE METABOLIC PANEL
ALT: 83 U/L — ABNORMAL HIGH (ref 17–63)
AST: 43 U/L — ABNORMAL HIGH (ref 15–41)
Albumin: 3 g/dL — ABNORMAL LOW (ref 3.5–5.0)
Alkaline Phosphatase: 62 U/L (ref 38–126)
Anion gap: 8 (ref 5–15)
BUN: 26 mg/dL — ABNORMAL HIGH (ref 6–20)
CHLORIDE: 100 mmol/L — AB (ref 101–111)
CO2: 23 mmol/L (ref 22–32)
Calcium: 8.8 mg/dL — ABNORMAL LOW (ref 8.9–10.3)
Creatinine, Ser: 0.81 mg/dL (ref 0.61–1.24)
Glucose, Bld: 201 mg/dL — ABNORMAL HIGH (ref 65–99)
POTASSIUM: 4.5 mmol/L (ref 3.5–5.1)
Sodium: 131 mmol/L — ABNORMAL LOW (ref 135–145)
Total Bilirubin: 0.8 mg/dL (ref 0.3–1.2)
Total Protein: 6.1 g/dL — ABNORMAL LOW (ref 6.5–8.1)

## 2015-09-24 LAB — HEPATIC FUNCTION PANEL: Bilirubin, Total: 0.8 mg/dL

## 2015-09-24 SURGERY — ECHOCARDIOGRAM, TRANSESOPHAGEAL
Anesthesia: Moderate Sedation

## 2015-09-24 MED ORDER — FENTANYL CITRATE (PF) 100 MCG/2ML IJ SOLN
INTRAMUSCULAR | Status: DC | PRN
  Administered 2015-09-24 (×2): 25 ug via INTRAVENOUS

## 2015-09-24 MED ORDER — MIDAZOLAM HCL 10 MG/2ML IJ SOLN
INTRAMUSCULAR | Status: DC | PRN
  Administered 2015-09-24: 1 mg via INTRAVENOUS
  Administered 2015-09-24: 2 mg via INTRAVENOUS

## 2015-09-24 MED ORDER — BUTAMBEN-TETRACAINE-BENZOCAINE 2-2-14 % EX AERO
INHALATION_SPRAY | CUTANEOUS | Status: DC | PRN
  Administered 2015-09-24: 2 via TOPICAL

## 2015-09-24 MED ORDER — MIDAZOLAM HCL 5 MG/ML IJ SOLN
INTRAMUSCULAR | Status: AC
  Filled 2015-09-24: qty 2

## 2015-09-24 MED ORDER — DIPHENHYDRAMINE HCL 50 MG/ML IJ SOLN
INTRAMUSCULAR | Status: AC
  Filled 2015-09-24: qty 1

## 2015-09-24 MED ORDER — SODIUM CHLORIDE 0.9 % IV SOLN: INTRAVENOUS | Status: DC

## 2015-09-24 MED ORDER — CEFAZOLIN IN D5W 1 GM/50ML IV SOLN
1.0000 g | Freq: Three times a day (TID) | INTRAVENOUS | Status: DC
  Administered 2015-09-24 – 2015-09-25 (×2): 1 g via INTRAVENOUS
  Filled 2015-09-24 (×4): qty 50

## 2015-09-24 MED ORDER — FENTANYL CITRATE (PF) 100 MCG/2ML IJ SOLN
INTRAMUSCULAR | Status: AC
  Filled 2015-09-24: qty 2

## 2015-09-24 NOTE — Progress Notes (Addendum)
INFECTIOUS DISEASE PROGRESS NOTE  ID: Martin Norton is a 60 y.o. male with  Active Problems:   Right thigh pain   Diffuse large B-cell lymphoma (HCC)   Arterial hypotension   Lactic acid acidosis   Hypotension   Staphylococcus aureus bacteremia with sepsis (HCC)   Septic shock (HCC)   Leg pain, right   Pleural effusion   Infected venous access port   Palliative care encounter   Goals of care, counseling/discussion   Sepsis ([place])   Acute confusional state   Neoplasm related pain  Subjective: Awake and alert, o x 3  No compliants  Abtx:  Anti-infectives    Start     Dose/Rate Route Frequency Ordered Stop   09/20/15 1000  vancomycin (VANCOCIN) 1,250 mg in sodium chloride 0.9 % 250 mL IVPB     1,250 mg 166.7 mL/hr over 90 Minutes Intravenous Every 12 hours 09/20/15 0929     09/19/15 2359  aztreonam (AZACTAM) 1 g in dextrose 5 % 50 mL IVPB  Status:  Discontinued     1 g 100 mL/hr over 30 Minutes Intravenous Every 8 hours 09/19/15 1445 09/19/15 2233   09/19/15 2200  linezolid (ZYVOX) IVPB 600 mg  Status:  Discontinued     600 mg 300 mL/hr over 60 Minutes Intravenous Every 12 hours 09/19/15 1419 09/20/15 0841   09/19/15 1800  vancomycin (VANCOCIN) IVPB 1000 mg/200 mL premix  Status:  Discontinued     1,000 mg 200 mL/hr over 60 Minutes Intravenous Every 12 hours 09/19/15 1046 09/19/15 1347   09/19/15 1800  piperacillin-tazobactam (ZOSYN) IVPB 3.375 g  Status:  Discontinued     3.375 g 12.5 mL/hr over 240 Minutes Intravenous Every 8 hours 09/19/15 1046 09/19/15 1347   09/19/15 1800  levofloxacin (LEVAQUIN) IVPB 750 mg  Status:  Discontinued     750 mg 100 mL/hr over 90 Minutes Intravenous Every 24 hours 09/19/15 1446 09/19/15 2233   09/19/15 1600  aztreonam (AZACTAM) 2 g in dextrose 5 % 50 mL IVPB     2 g 100 mL/hr over 30 Minutes Intravenous  Once 09/19/15 1445 09/19/15 1815   09/19/15 0900  piperacillin-tazobactam (ZOSYN) IVPB 3.375 g     3.375 g 100 mL/hr over 30  Minutes Intravenous  Once 09/19/15 0855 09/19/15 1014   09/19/15 0900  vancomycin (VANCOCIN) IVPB 1000 mg/200 mL premix     1,000 mg 200 mL/hr over 60 Minutes Intravenous  Once 09/19/15 0855 09/19/15 1112      Medications:  Scheduled: . dexamethasone  8 mg Intravenous Q12H  . famotidine (PEPCID) IV  20 mg Intravenous Q12H  . fentaNYL  50 mcg Transdermal Q72H  . heparin subcutaneous  5,000 Units Subcutaneous Q8H  . vancomycin  1,250 mg Intravenous Q12H    Objective: Vital signs in last 24 hours: Temp:  [97.6 F (36.4 C)-98.3 F (36.8 C)] 97.7 F (36.5 C) (07/11 1400) Pulse Rate:  [59-99] 82 (07/11 1400) Resp:  [10-20] 19 (07/11 1400) BP: (136-203)/(78-110) 160/110 mmHg (07/11 1400) SpO2:  [94 %-100 %] 100 % (07/11 1400)   General appearance: alert, cooperative and no distress Resp: clear to auscultation bilaterally Cardio: regular rate and rhythm GI: normal findings: bowel sounds normal and soft, non-tender Extremities: edema anasarca with blistering on L foot.   Lab Results  Recent Labs  09/22/15 0416 09/24/15 0325  WBC 8.2 10.5  HGB 8.8* 9.5*  HCT 25.6* 28.5*  NA 130* 131*  K 4.2 4.5  CL 101 100*  CO2 23 23  BUN 28* 26*  CREATININE 0.73 0.81   Liver Panel  Recent Labs  09/24/15 0325  PROT 6.1*  ALBUMIN 3.0*  AST 43*  ALT 83*  ALKPHOS 62  BILITOT 0.8   Sedimentation Rate No results for input(s): ESRSEDRATE in the last 72 hours. C-Reactive Protein No results for input(s): CRP in the last 72 hours.  Microbiology: Recent Results (from the past 240 hour(s))  Blood culture (routine x 2)     Status: Abnormal   Collection Time: 09/19/15  8:40 AM  Result Value Ref Range Status   Specimen Description BLOOD LEFT ANTECUBITAL  Final   Special Requests BOTTLES DRAWN AEROBIC AND ANAEROBIC 5ML  Final   Culture  Setup Time   Final    GRAM POSITIVE COCCI IN CLUSTERS IN BOTH AEROBIC AND ANAEROBIC BOTTLES CRITICAL RESULT CALLED TO, READ BACK BY AND VERIFIED  WITH: PHARM L POWELL AT 2159 [id number] Wallace Performed at [place] (A)  Final   Report Status 09/22/2015 FINAL  Final   Organism ID, Bacteria STAPHYLOCOCCUS AUREUS  Final      Susceptibility   Staphylococcus aureus - MIC*    CIPROFLOXACIN <=0.5 SENSITIVE Sensitive     ERYTHROMYCIN 4 RESISTANT Resistant     GENTAMICIN <=0.5 SENSITIVE Sensitive     OXACILLIN 0.5 SENSITIVE Sensitive     TETRACYCLINE <=1 SENSITIVE Sensitive     VANCOMYCIN <=0.5 SENSITIVE Sensitive     TRIMETH/SULFA <=10 SENSITIVE Sensitive     CLINDAMYCIN <=0.25 RESISTANT Resistant     RIFAMPIN <=0.5 SENSITIVE Sensitive     Inducible Clindamycin POSITIVE Resistant     * STAPHYLOCOCCUS AUREUS  Blood Culture ID Panel (Reflexed)     Status: Abnormal   Collection Time: 09/19/15  8:40 AM  Result Value Ref Range Status   Enterococcus species NOT DETECTED NOT DETECTED Final   Vancomycin resistance NOT DETECTED NOT DETECTED Final   Listeria monocytogenes NOT DETECTED NOT DETECTED Final   Staphylococcus species DETECTED (A) NOT DETECTED Final    Comment: CRITICAL RESULT CALLED TO, READ BACK BY AND VERIFIED WITH: PHARM L POWELL AT 2159 [id number] MARTINB    Staphylococcus aureus DETECTED (A) NOT DETECTED Final    Comment: CRITICAL RESULT CALLED TO, READ BACK BY AND VERIFIED WITH: PHARM L POWELL AT 2159 [id number] MARTINB    Methicillin resistance NOT DETECTED NOT DETECTED Final   Streptococcus species NOT DETECTED NOT DETECTED Final   Streptococcus agalactiae NOT DETECTED NOT DETECTED Final   Streptococcus pneumoniae NOT DETECTED NOT DETECTED Final   Streptococcus pyogenes NOT DETECTED NOT DETECTED Final   Acinetobacter baumannii NOT DETECTED NOT DETECTED Final   Enterobacteriaceae species NOT DETECTED NOT DETECTED Final   Enterobacter cloacae complex NOT DETECTED NOT DETECTED Final   Escherichia coli NOT DETECTED NOT DETECTED Final   Klebsiella oxytoca NOT DETECTED NOT DETECTED  Final   Klebsiella pneumoniae NOT DETECTED NOT DETECTED Final   Proteus species NOT DETECTED NOT DETECTED Final   Serratia marcescens NOT DETECTED NOT DETECTED Final   Carbapenem resistance NOT DETECTED NOT DETECTED Final   Haemophilus influenzae NOT DETECTED NOT DETECTED Final   Neisseria meningitidis NOT DETECTED NOT DETECTED Final   Pseudomonas aeruginosa NOT DETECTED NOT DETECTED Final   Candida albicans NOT DETECTED NOT DETECTED Final   Candida glabrata NOT DETECTED NOT DETECTED Final   Candida krusei NOT DETECTED NOT DETECTED Final   Candida parapsilosis NOT DETECTED NOT DETECTED Final   Candida  tropicalis NOT DETECTED NOT DETECTED Final  Blood culture (routine x 2)     Status: Abnormal   Collection Time: 09/19/15  9:10 AM  Result Value Ref Range Status   Specimen Description BLOOD LEFT HAND  Final   Special Requests IN PEDIATRIC BOTTLE 2ML  Final   Culture  Setup Time   Final    GRAM POSITIVE COCCI IN CLUSTERS AEROBIC BOTTLE ONLY CRITICAL RESULT CALLED TO, READ BACK BY AND VERIFIED WITH: Lavell Luster [facility] 2221 09/19/15 A BROWNING    Culture (A)  Final    STAPHYLOCOCCUS AUREUS SUSCEPTIBILITIES PERFORMED ON PREVIOUS CULTURE WITHIN THE LAST 5 DAYS. Performed at [facility]    Report Status 09/22/2015 FINAL  Final  Urine culture     Status: None   Collection Time: 09/19/15 11:56 AM  Result Value Ref Range Status   Specimen Description URINE, CLEAN CATCH  Final   Special Requests NONE  Final   Culture NO GROWTH Performed at [facility]   Final   Report Status 09/20/2015 FINAL  Final  Culture, blood (Routine X 2) w Reflex to ID Panel     Status: Abnormal   Collection Time: 09/20/15  9:15 AM  Result Value Ref Range Status   Specimen Description BLOOD LEFT HAND  Final   Special Requests IN PEDIATRIC BOTTLE 4CC  Final   Culture  Setup Time   Final    GRAM POSITIVE COCCI IN CLUSTERS AEROBIC BOTTLE ONLY CRITICAL RESULT CALLED TO, READ BACK BY AND VERIFIED WITH:  A RUNYON 09/21/15 @ 0751 M VESTAL    Culture (A)  Final    STAPHYLOCOCCUS AUREUS SUSCEPTIBILITIES PERFORMED ON PREVIOUS CULTURE WITHIN THE LAST 5 DAYS. Performed at [facility]    Report Status 09/22/2015 FINAL  Final  Culture, blood (Routine X 2) w Reflex to ID Panel     Status: Abnormal   Collection Time: 09/20/15  9:15 AM  Result Value Ref Range Status   Specimen Description BLOOD LEFT HAND  Final   Special Requests IN PEDIATRIC BOTTLE 3CC  Final   Culture  Setup Time   Final    GRAM POSITIVE COCCI IN CLUSTERS AEROBIC BOTTLE ONLY CRITICAL RESULT CALLED TO, READ BACK BY AND VERIFIED WITH: A RUNYON 09/21/15 @ 0751 M VESTAL    Culture (A)  Final    STAPHYLOCOCCUS AUREUS SUSCEPTIBILITIES PERFORMED ON PREVIOUS CULTURE WITHIN THE LAST 5 DAYS. Performed at [facility]    Report Status 09/22/2015 FINAL  Final  Anaerobic culture     Status: None   Collection Time: 09/21/15  7:30 AM  Result Value Ref Range Status   Specimen Description PORTA CATH  Final   Special Requests NONE  Final   Culture   Final    SPECIMEN/CONTAINER TYPE INAPPROPRIATE FOR ORDERED TEST, UNABLE TO Arlis Porta, RN AT 1103 09/23/15 BY L BENFIELD Performed at [facility]    Report Status 09/23/2015 FINAL  Final  Surgical pcr screen     Status: Abnormal   Collection Time: 09/21/15  7:36 AM  Result Value Ref Range Status   MRSA, PCR NEGATIVE NEGATIVE Final   Staphylococcus aureus POSITIVE (A) NEGATIVE Final    Comment:        The Xpert SA Assay (FDA approved for NASAL specimens in patients over 58 years of age), is one component of a comprehensive surveillance program.  Test performance has been validated by [facility] for patients greater than or equal to 1  year old. It is not intended to diagnose infection nor to guide or monitor treatment.   Culture, blood (routine x 2)     Status: None (Preliminary result)   Collection Time: 09/21/15  2:36 PM  Result Value Ref Range Status    Specimen Description BLOOD RIGHT ANTECUBITAL  Final   Special Requests BOTTLES DRAWN AEROBIC AND ANAEROBIC 10CC  Final   Culture   Final    NO GROWTH 3 DAYS Performed at [facility]    Report Status PENDING  Incomplete  Culture, blood (routine x 2)     Status: None (Preliminary result)   Collection Time: 09/21/15  2:36 PM  Result Value Ref Range Status   Specimen Description BLOOD RIGHT HAND  Final   Special Requests BOTTLES DRAWN AEROBIC ONLY [place]  Final   Culture   Final    NO GROWTH 3 DAYS Performed at [facility]    Report Status PENDING  Incomplete    Studies/Results: No results found.   Assessment/Plan: MSSA bacteremia Porta-cath (removed) LV nodule- not felt to be IE on TEE Large Cell B Lymphoma PEN (zosyn) allergy  TEE no vegetation. Will change him to ancef- spoke with pt, RN regarding low risk of ADR. Repeat BCx 7-8 are ngtd Would aim for 2 weeks of ancef Suspect blisters are due to edema.   Total days of antibiotics: 4 vanco         Bobby Rumpf Infectious Diseases (pager) [phone] [email or web address] 09/24/2015, 5:13 PM  LOS: 4 days

## 2015-09-24 NOTE — Progress Notes (Signed)
Pt woke up confused and phoned girlfriend, [name].  [name] called nursing station.  Checked on pt, still slightly confused, IV patent, skin tear from Tegaderm; IV site cleaned and dressing changed.  Pt alert and oriented. Will continue to monitor.

## 2015-09-24 NOTE — Progress Notes (Signed)
Pharmacy Antibiotic Follow-up Note  Martin Norton is a 60 y.o. year-old male admitted on 09/19/2015.  One dose of Vancomycin & Zosyn given in ED on 7/6, for probable sepsis. Swelling of upper lip noted by girlfriend but not by patient, attributed to abx, unable to distinguish if Vancomycin or Zosyn.  Abx changed to Linezolid, Aztreonam, Levaquin for treatment of sepsis.   ID consulted 7/7, per discussion with Dr. Tommy Medal, ok to change Zyvox to Vancomycin as symptoms more consistent with PCN allergy than vancomycin and patient did not notice them himself.  ID now considering changing to Ancef - cephalosporins generally have 1-5% cross reactivity with penicillins. No cephalosporin administration found per review in Epic; on admission, patient reported no allergies to any medications.  Assessment/Plan:  Continue Vancomycin 1250mg  IV q12h. VT goal = 15-20 mcg/ml.  If not changed to Ancef, will repeat vancomycin trough in 2-3 days if SCr remains stable  Temp (24hrs), Avg:98.1 F (36.7 C), Min:97.6 F (36.4 C), Max:98.7 F (37.1 C)   Recent Labs Lab 09/19/15 0416 09/20/15 0625 09/22/15 0416 09/24/15 0325  WBC 9.5 11.3* 8.2 10.5     Recent Labs Lab 09/19/15 0416 09/20/15 0625 09/21/15 2101 09/22/15 0416 09/24/15 0325  CREATININE 0.72 1.07 0.82 0.73 0.81   Estimated Creatinine Clearance: 111.2 mL/min (by C-G formula based on Cr of 0.81).    Allergies  Allergen Reactions  . Zosyn [Piperacillin Sod-Tazobactam So]     Lip swelling     Antimicrobials this admission: 7/6 Vancomcyin x 1 7/6 Zosyn x 1 7/6 Levoflox x 1 7/6 Aztreo x 1 7/6 Zyvox >> 7/7 7/7 Vancomycin >>  Levels/dose changes this admission: 7/8 2100 trough: 19 mg/mL on 1250 mg q12h, continue same  Microbiology results: 7/6 BCx: 2/2 MSSA (BCID = MSSA) 7/6 UCx: NGF 7/7 BCx: 2/2 MSSA 7/7 HIV Ab: NR 7/7 Hep C Ab: neg 7/8 BCx: ngtd 7/8 PAC tip: not cultured correctly  Thank you for allowing pharmacy to be a  part of this patient's care.  Peggyann Juba, PharmD, BCPS Pager: [phone] 09/24/2015 12:42 PM

## 2015-09-24 NOTE — Interval H&P Note (Signed)
History and Physical Interval Note:  09/24/2015 10:15 AM  Martin Norton  has presented today for surgery, with the diagnosis of bacteremia  The various methods of treatment have been discussed with the patient and family. After consideration of risks, benefits and other options for treatment, the patient has consented to  Procedure(s): TRANSESOPHAGEAL ECHOCARDIOGRAM (TEE) (N/A) as a surgical intervention .  The patient's history has been reviewed, patient examined, no change in status, stable for surgery.  I have reviewed the patient's chart and labs.  Questions were answered to the patient's satisfaction.     Fransico Him

## 2015-09-24 NOTE — CV Procedure (Signed)
    PROCEDURE NOTE:  Procedure:  Transesophageal echocardiogram Operator:  Fransico Him, MD Indications:  Bacteremia Complications: None  During this procedure the patient is administered a total of Versed 3 mg and Fentanyl 50 mg to achieve and maintain moderate conscious sedation.  The patient's heart rate, blood pressure, and oxygen saturation are monitored continuously during the procedure. The period of conscious sedation is 18 minutes, of which I was present face-to-face 100% of this time.  Results: Normal LV size and mild LV dysfunction EF 40-45% Normal RV size and function Normal RA Normal LA and LA appendage with no evidence of thrombus Normal TV with mild TR Normal PV with trivial PR Normal MV with mild MAC.  No evidence of vegetation.  There is mild MR. Mildly thickened and calcified trileaflet aortic valve consistent with aortic valve sclerosis.  There is a small density on the ventricular side of the AV cusps that is most likely due to calcification and is nonmobile.  No obvious vegetation noted. Normal interatrial septum with no evidence of shunt by colorflow dopper  Mild atherosclerosis of the thoracic and ascending aorta.  The patient tolerated the procedure well and was transferred back to their room in stable condition.  Signed: Fransico Him, MD [facility]

## 2015-09-24 NOTE — Progress Notes (Signed)
  Echocardiogram Echocardiogram Transesophageal has been performed.  Martin Norton 09/24/2015, 11:08 AM

## 2015-09-24 NOTE — NC FL2 (Signed)
[place] LEVEL OF CARE SCREENING TOOL     IDENTIFICATION  Patient Name: Martin Norton Birthdate: [birth year] Sex: male Admission Date (Current Location): 09/19/2015  [facility] [place] Number:  Herbalist and Address:  [facility],  [place] [address] [place]      Provider Number: [id number]  Attending Physician Name and Address:  Velvet Bathe, MD  Relative Name and Phone Number:       Current Level of Care: Hospital Recommended Level of Care: [place] Prior Approval Number:    Date Approved/Denied:   PASRR Number: [id number]  Discharge Plan: SNF    Current Diagnoses: Patient Active Problem List   Diagnosis Date Noted  . Acute confusional state   . Neoplasm related pain   . Sepsis ([place])   . Palliative care encounter   . Goals of care, counseling/discussion   . Staphylococcus aureus bacteremia with sepsis ([place])   . Septic shock ([place])   . Leg pain, right   . Pleural effusion   . Infected venous access port   . Hypotension 09/19/2015  . Right thigh pain   . 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 Cancer associated pain   . Arterial hypotension   . Lactic acid acidosis     Orientation RESPIRATION BLADDER Height & Weight     Self, Time, Situation, Place  Normal Continent Weight: 203 lb 4.2 oz (92.2 kg) Height:  5\' 11"  (180.3 cm)  BEHAVIORAL SYMPTOMS/MOOD NEUROLOGICAL BOWEL NUTRITION STATUS      Continent Diet (regular)  AMBULATORY STATUS COMMUNICATION OF NEEDS Skin   Limited Assist Verbally Normal                       Personal Care Assistance Level of Assistance  Bathing, Dressing Bathing Assistance: Limited assistance   Dressing Assistance: Limited assistance     Functional Limitations Info             SPECIAL CARE FACTORS FREQUENCY  PT (By licensed PT), OT (By licensed OT)     PT Frequency: 5 OT Frequency: 5            Contractures      Additional Factors Info  Code  Status, Allergies Code Status Info: DNR Allergies Info: Allergies: Zosyn           Current Medications (09/24/2015):  This is the current hospital active medication list Current Facility-Administered Medications  Medication Dose Route Frequency Provider Last Rate Last Dose  . 0.9 %  sodium chloride infusion   Intravenous Continuous Belkys A Regalado, MD 100 mL/hr at 09/24/15 1254    . acetaminophen (TYLENOL) tablet 650 mg  650 mg Oral Q6H PRN Belkys A Regalado, MD       Or  . acetaminophen (TYLENOL) suppository 650 mg  650 mg Rectal Q6H PRN Belkys A Regalado, MD      . dexamethasone (DECADRON) injection 8 mg  8 mg Intravenous Q12H Micheline Rough, MD   8 mg at 09/24/15 1255  . famotidine (PEPCID) IVPB 20 mg premix  20 mg Intravenous Q12H Belkys A Regalado, MD   20 mg at 09/23/15 2142  . fentaNYL (DURAGESIC - dosed mcg/hr) patch 50 mcg  50 mcg Transdermal Q72H Donita Brooks, NP   50 mcg at 09/22/15 1322  . fentaNYL (SUBLIMAZE) injection 25 mcg  25 mcg Intravenous Q1H PRN Micheline Rough, MD      . heparin injection 5,000 Units  5,000 Units Subcutaneous Q8H Berton Mount, RPH   5,000 Units at 09/24/15 0631  . HYDROcodone-acetaminophen (NORCO/VICODIN) 5-325 MG per tablet 1-2 tablet  1-2 tablet Oral Q4H PRN Alphonsa Overall, MD      . ondansetron [facility]) tablet 4 mg  4 mg Oral Q6H PRN Belkys A Regalado, MD       Or  . ondansetron (ZOFRAN) injection 4 mg  4 mg Intravenous Q6H PRN Belkys A Regalado, MD      . vancomycin (VANCOCIN) 1,250 mg in sodium chloride 0.9 % 250 mL IVPB  1,250 mg Intravenous Q12H Emiliano Dyer, RPH   1,250 mg at 09/24/15 1255     Discharge Medications: Please see discharge summary for a list of discharge medications.  Relevant Imaging Results:  Relevant Lab Results:   Additional Information [id number]  [name], LCSW

## 2015-09-24 NOTE — Progress Notes (Signed)
PROGRESS NOTE    Martin Norton  [id number] DOB: Sep 29, 1955 DOA: 09/19/2015 PCP: Red Christians, MD   Brief Narrative:  60 y.o. 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 He report his pain is severe, pain medications not helping at home. Has history of diffuse large cell lymphoma.   Patient was sent for TEE to [facility] cone 09/24/2015  Assessment & Plan:   Active Problems:     Septic shock (HCC) - IV antibiotics per ID - sepsis physiology resolved.   MSSA bacteremia -ID on board and managing antibiotic regimen. - Continue supportive therapy - Consulted general surgery for removal of port. Has been completed today.  - decreasing decadron frequency in lieu of active infection - TEE evaluation  09/24/2015  Right thigh pain - suspect it is secondary to diffuse large B cell lymphoma - Ultrasound of lower extremities negative for deep vein thrombosis.    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 Patient is seen at [facility] but would like to consider been evaluated here in [place]. If patient makes up his mind that he wants to be seen in [place] please have secretary set up follow-up appointment on discharge    Arterial hypotension   Lactic acid acidosis - Likely secondary to sepsis    Hypotension - Improved with IV rehydration   Staphylococcus aureus bacteremia with sepsis (HCC)    Leg pain, right   Pleural effusion  DVT prophylaxis: We will continue on heparin Code Status: DO NOT RESUSCITATE Family Communication: Discussed directly with patient Disposition Plan: Pending improvement in condition and work up  Consultants:   General surgery  ID  Palliative care  Procedures: None    Antimicrobials:  Vancomycin  Subjective: Pt has no new complaints. He had many questions as well as his fiance or girlfriend this morning which were answered to their satisfaction.   Objective: Filed Vitals:   09/24/15 1120 09/24/15 1130 09/24/15 1236 09/24/15 1400  BP: 173/88 167/78 159/106 160/110  Pulse: 59 82 80 82  Temp:   97.7 F (36.5 C) 97.7 F (36.5 C)  TempSrc:   Oral Oral  Resp: 12 18 20 19   Height:      Weight:      SpO2: 96% 99% 99% 100%    Intake/Output Summary (Last 24 hours) at 09/24/15 1813 Last data filed at 09/24/15 1400  Gross per 24 hour  Intake      0 ml  Output   1700 ml  Net  -1700 ml   Filed Weights   09/19/15 0937 09/19/15 1601  Weight: 86.183 kg (190 lb) 92.2 kg (203 lb 4.2 oz)    Examination:  General exam: Appears calm and comfortable  Respiratory system: Clear to auscultation. Respiratory effort normal. Cardiovascular system: S1 & S2 heard, RRR. No rubs Gastrointestinal system: Abdomen is nondistended, soft and nontender. No organomegaly or masses felt. Normal bowel sounds heard. Central nervous system: Alert and oriented. No focal neurological deficits. Extremities: Symmetric 5 x 5 power. Skin: No rashes, lesions or ulcers Psychiatry: Judgement and insight appear normal. Mood & affect appropriate.   Data Reviewed: I have personally reviewed following labs and imaging studies  CBC:  Recent Labs Lab 09/19/15 0416 09/20/15 0625 09/22/15 0416 09/24/15 0325  WBC 9.5 11.3* 8.2  10.5  NEUTROABS 9.2*  --   --   --   HGB 10.2* 9.2* 8.8* 9.5*  HCT 29.4* 26.5* 25.6* 28.5*  MCV 88.6 89.5 87.4 89.3  PLT 194 135* 133* 0000000   Basic Metabolic Panel:  Recent Labs Lab 09/19/15 0416 09/20/15 0625 09/21/15 2101 09/22/15 0416 09/24/15 0325  NA 129* 130*  --  130* 131*  K 3.6 4.6  --  4.2 4.5  CL 94* 101  --  101 100*  CO2 23 21*  --  23 23  GLUCOSE 72 128*  --  191* 201*  BUN 27* 32*  --  28* 26*  CREATININE 0.72 1.07 0.82 0.73  0.81  CALCIUM 9.9 8.4*  --  8.9 8.8*   GFR: Estimated Creatinine Clearance: 111.2 mL/min (by C-G formula based on Cr of 0.81). Liver Function Tests:  Recent Labs Lab 09/19/15 0422 09/24/15 0325  AST 31 43*  ALT 25 83*  ALKPHOS 42 62  BILITOT 0.6 0.8  PROT 6.2* 6.1*  ALBUMIN 3.6 3.0*   No results for input(s): LIPASE, AMYLASE in the last 168 hours. No results for input(s): AMMONIA in the last 168 hours. Coagulation Profile: No results for input(s): INR, PROTIME in the last 168 hours. Cardiac Enzymes: No results for input(s): CKTOTAL, CKMB, CKMBINDEX, TROPONINI in the last 168 hours. BNP (last 3 results) No results for input(s): PROBNP in the last 8760 hours. HbA1C: No results for input(s): HGBA1C in the last 72 hours. CBG:  Recent Labs Lab 09/21/15 0859  GLUCAP 160*   Lipid Profile: No results for input(s): CHOL, HDL, LDLCALC, TRIG, CHOLHDL, LDLDIRECT in the last 72 hours. Thyroid Function Tests: No results for input(s): TSH, T4TOTAL, FREET4, T3FREE, THYROIDAB in the last 72 hours. Anemia Panel: No results for input(s): VITAMINB12, FOLATE, FERRITIN, TIBC, IRON, RETICCTPCT in the last 72 hours. Sepsis Labs:  Recent Labs Lab 09/19/15 0847 09/19/15 1410  LATICACIDVEN 4.51* 3.35*    Recent Results (from the past 240 hour(s))  Blood culture (routine x 2)     Status: Abnormal   Collection Time: 09/19/15  8:40 AM  Result Value Ref Range Status   Specimen Description BLOOD LEFT ANTECUBITAL  Final   Special Requests BOTTLES DRAWN AEROBIC AND ANAEROBIC 5ML  Final   Culture  Setup Time   Final    GRAM POSITIVE COCCI IN CLUSTERS IN BOTH AEROBIC AND ANAEROBIC BOTTLES CRITICAL RESULT CALLED TO, READ BACK BY AND VERIFIED WITH: PHARM L POWELL AT 2159 [id number] [place] Performed at [facility] (A)  Final   Report Status 09/22/2015 FINAL  Final   Organism ID, Bacteria STAPHYLOCOCCUS AUREUS  Final      Susceptibility    Staphylococcus aureus - MIC*    CIPROFLOXACIN <=0.5 SENSITIVE Sensitive     ERYTHROMYCIN 4 RESISTANT Resistant     GENTAMICIN <=0.5 SENSITIVE Sensitive     OXACILLIN 0.5 SENSITIVE Sensitive     TETRACYCLINE <=1 SENSITIVE Sensitive     VANCOMYCIN <=0.5 SENSITIVE Sensitive     TRIMETH/SULFA <=10 SENSITIVE Sensitive     CLINDAMYCIN <=0.25 RESISTANT Resistant     RIFAMPIN <=0.5 SENSITIVE Sensitive     Inducible Clindamycin POSITIVE Resistant     * STAPHYLOCOCCUS AUREUS  Blood Culture ID Panel (Reflexed)     Status: Abnormal   Collection Time: 09/19/15  8:40 AM  Result Value Ref Range Status   Enterococcus species NOT DETECTED NOT DETECTED Final   Vancomycin resistance NOT DETECTED NOT  DETECTED Final   Listeria monocytogenes NOT DETECTED NOT DETECTED Final   Staphylococcus species DETECTED (A) NOT DETECTED Final    Comment: CRITICAL RESULT CALLED TO, READ BACK BY AND VERIFIED WITH: PHARM L POWELL AT 2159 [id number] MARTINB    Staphylococcus aureus DETECTED (A) NOT DETECTED Final    Comment: CRITICAL RESULT CALLED TO, READ BACK BY AND VERIFIED WITH: PHARM L POWELL AT 2159 [id number] MARTINB    Methicillin resistance NOT DETECTED NOT DETECTED Final   Streptococcus species NOT DETECTED NOT DETECTED Final   Streptococcus agalactiae NOT DETECTED NOT DETECTED Final   Streptococcus pneumoniae NOT DETECTED NOT DETECTED Final   Streptococcus pyogenes NOT DETECTED NOT DETECTED Final   Acinetobacter baumannii NOT DETECTED NOT DETECTED Final   Enterobacteriaceae species NOT DETECTED NOT DETECTED Final   Enterobacter cloacae complex NOT DETECTED NOT DETECTED Final   Escherichia coli NOT DETECTED NOT DETECTED Final   Klebsiella oxytoca NOT DETECTED NOT DETECTED Final   Klebsiella pneumoniae NOT DETECTED NOT DETECTED Final   Proteus species NOT DETECTED NOT DETECTED Final   Serratia marcescens NOT DETECTED NOT DETECTED Final   Carbapenem resistance NOT DETECTED NOT DETECTED Final   Haemophilus  influenzae NOT DETECTED NOT DETECTED Final   Neisseria meningitidis NOT DETECTED NOT DETECTED Final   Pseudomonas aeruginosa NOT DETECTED NOT DETECTED Final   Candida albicans NOT DETECTED NOT DETECTED Final   Candida glabrata NOT DETECTED NOT DETECTED Final   Candida krusei NOT DETECTED NOT DETECTED Final   Candida parapsilosis NOT DETECTED NOT DETECTED Final   Candida tropicalis NOT DETECTED NOT DETECTED Final  Blood culture (routine x 2)     Status: Abnormal   Collection Time: 09/19/15  9:10 AM  Result Value Ref Range Status   Specimen Description BLOOD LEFT HAND  Final   Special Requests IN PEDIATRIC BOTTLE 2ML  Final   Culture  Setup Time   Final    GRAM POSITIVE COCCI IN CLUSTERS AEROBIC BOTTLE ONLY CRITICAL RESULT CALLED TO, READ BACK BY AND VERIFIED WITH: Lavell Luster [facility] 2221 09/19/15 A BROWNING    Culture (A)  Final    STAPHYLOCOCCUS AUREUS SUSCEPTIBILITIES PERFORMED ON PREVIOUS CULTURE WITHIN THE LAST 5 DAYS. Performed at [facility]    Report Status 09/22/2015 FINAL  Final  Urine culture     Status: None   Collection Time: 09/19/15 11:56 AM  Result Value Ref Range Status   Specimen Description URINE, CLEAN CATCH  Final   Special Requests NONE  Final   Culture NO GROWTH Performed at [facility]   Final   Report Status 09/20/2015 FINAL  Final  Culture, blood (Routine X 2) w Reflex to ID Panel     Status: Abnormal   Collection Time: 09/20/15  9:15 AM  Result Value Ref Range Status   Specimen Description BLOOD LEFT HAND  Final   Special Requests IN PEDIATRIC BOTTLE 4CC  Final   Culture  Setup Time   Final    GRAM POSITIVE COCCI IN CLUSTERS AEROBIC BOTTLE ONLY CRITICAL RESULT CALLED TO, READ BACK BY AND VERIFIED WITH: A RUNYON 09/21/15 @ 0751 M VESTAL    Culture (A)  Final    STAPHYLOCOCCUS AUREUS SUSCEPTIBILITIES PERFORMED ON PREVIOUS CULTURE WITHIN THE LAST 5 DAYS. Performed at [facility]    Report Status 09/22/2015 FINAL  Final   Culture, blood (Routine X 2) w Reflex to ID Panel     Status: Abnormal   Collection Time: 09/20/15  9:15 AM  Result Value Ref Range Status   Specimen Description BLOOD LEFT HAND  Final   Special Requests IN PEDIATRIC BOTTLE 3CC  Final   Culture  Setup Time   Final    GRAM POSITIVE COCCI IN CLUSTERS AEROBIC BOTTLE ONLY CRITICAL RESULT CALLED TO, READ BACK BY AND VERIFIED WITH: A RUNYON 09/21/15 @ 0751 M VESTAL    Culture (A)  Final    STAPHYLOCOCCUS AUREUS SUSCEPTIBILITIES PERFORMED ON PREVIOUS CULTURE WITHIN THE LAST 5 DAYS. Performed at [facility]    Report Status 09/22/2015 FINAL  Final  Anaerobic culture     Status: None   Collection Time: 09/21/15  7:30 AM  Result Value Ref Range Status   Specimen Description PORTA CATH  Final   Special Requests NONE  Final   Culture   Final    SPECIMEN/CONTAINER TYPE INAPPROPRIATE FOR ORDERED TEST, UNABLE TO Arlis Porta, RN AT 1103 09/23/15 BY L BENFIELD Performed at [facility]    Report Status 09/23/2015 FINAL  Final  Surgical pcr screen     Status: Abnormal   Collection Time: 09/21/15  7:36 AM  Result Value Ref Range Status   MRSA, PCR NEGATIVE NEGATIVE Final   Staphylococcus aureus POSITIVE (A) NEGATIVE Final    Comment:        The Xpert SA Assay (FDA approved for NASAL specimens in patients over 56 years of age), is one component of a comprehensive surveillance program.  Test performance has been validated by [facility] for patients greater than or equal to 71 year old. It is not intended to diagnose infection nor to guide or monitor treatment.   Culture, blood (routine x 2)     Status: None (Preliminary result)   Collection Time: 09/21/15  2:36 PM  Result Value Ref Range Status   Specimen Description BLOOD RIGHT ANTECUBITAL  Final   Special Requests BOTTLES DRAWN AEROBIC AND ANAEROBIC 10CC  Final   Culture   Final    NO GROWTH 3 DAYS Performed at [facility]    Report Status PENDING   Incomplete  Culture, blood (routine x 2)     Status: None (Preliminary result)   Collection Time: 09/21/15  2:36 PM  Result Value Ref Range Status   Specimen Description BLOOD RIGHT HAND  Final   Special Requests BOTTLES DRAWN AEROBIC ONLY [place]  Final   Culture   Final    NO GROWTH 3 DAYS Performed at [facility]    Report Status PENDING  Incomplete     Radiology Studies: No results found.   Scheduled Meds: .  ceFAZolin (ANCEF) IV  1 g Intravenous Q8H  . dexamethasone  8 mg Intravenous Q12H  . famotidine (PEPCID) IV  20 mg Intravenous Q12H  . fentaNYL  50 mcg Transdermal Q72H  . heparin subcutaneous  5,000 Units Subcutaneous Q8H   Continuous Infusions: . sodium chloride 100 mL/hr at 09/24/15 1254    Time spent: > 35 minutes  Velvet Bathe, MD [facility] Pager [phone]  If 7PM-7AM, please contact night-coverage [email or web address] Password [facility] 09/24/2015, 6:13 PM

## 2015-09-25 DIAGNOSIS — I890 Lymphedema, not elsewhere classified: Secondary | ICD-10-CM | POA: Diagnosis present

## 2015-09-25 DIAGNOSIS — A4901 Methicillin susceptible Staphylococcus aureus infection, unspecified site: Secondary | ICD-10-CM | POA: Insufficient documentation

## 2015-09-25 DIAGNOSIS — I10 Essential (primary) hypertension: Secondary | ICD-10-CM

## 2015-09-25 MED ORDER — FUROSEMIDE 40 MG PO TABS: 40.0000 mg | ORAL_TABLET | Freq: Every day | ORAL | Status: DC

## 2015-09-25 MED ORDER — CEFAZOLIN SODIUM-DEXTROSE 2-4 GM/100ML-% IV SOLN
2.0000 g | Freq: Three times a day (TID) | INTRAVENOUS | Status: DC
  Administered 2015-09-25 – 2015-09-27 (×7): 2 g via INTRAVENOUS
  Filled 2015-09-25 (×7): qty 100

## 2015-09-25 MED ORDER — LISINOPRIL 10 MG PO TABS
10.0000 mg | ORAL_TABLET | Freq: Every day | ORAL | Status: DC
  Administered 2015-09-25 – 2015-09-27 (×3): 10 mg via ORAL
  Filled 2015-09-25 (×3): qty 1

## 2015-09-25 MED ORDER — POLYETHYLENE GLYCOL 3350 17 G PO PACK
17.0000 g | PACK | Freq: Every day | ORAL | Status: DC
  Administered 2015-09-25 – 2015-09-27 (×2): 17 g via ORAL
  Filled 2015-09-25 (×3): qty 1

## 2015-09-25 MED ORDER — FUROSEMIDE 40 MG PO TABS
40.0000 mg | ORAL_TABLET | Freq: Every day | ORAL | Status: DC
  Administered 2015-09-26 – 2015-09-27 (×2): 40 mg via ORAL
  Filled 2015-09-25 (×2): qty 1

## 2015-09-25 MED ORDER — FAMOTIDINE 20 MG PO TABS
10.0000 mg | ORAL_TABLET | Freq: Two times a day (BID) | ORAL | Status: DC
  Administered 2015-09-25 – 2015-09-27 (×5): 10 mg via ORAL
  Filled 2015-09-25 (×5): qty 1

## 2015-09-25 MED ORDER — FUROSEMIDE 10 MG/ML IJ SOLN
40.0000 mg | Freq: Once | INTRAMUSCULAR | Status: AC
  Administered 2015-09-25: 40 mg via INTRAVENOUS
  Filled 2015-09-25: qty 4

## 2015-09-25 NOTE — Progress Notes (Signed)
Daily Progress Note   Patient Name: Martin Norton       Date: 09/25/2015 DOB: 10-14-1955  Age: 60 y.o. MRN#: [id number] Attending Physician: Louellen Molder, MD Primary Care Physician: Red Christians, MD Admit Date: 09/19/2015  Reason for Consultation/Follow-up: Establishing goals of care and Pain control  Subjective: Awake alert resting in bed He is in good spirits this am, he states that he was given good news yesterday from his TEE results.  ID is following, note reviewed, appreciate input, discussed with patient regarding antibiotic choice Pain is well controlled He is eating well He moved his bowels yesterday No confusion noted this am, agree with steroid taper He is concerned about the blisters on his foot, reassured him that antibiotics are being adjusted, encouraged elevation and proper wound care. Will recommend formal wound care consult.   Length of Stay: 5  Current Medications: Scheduled Meds:  .  ceFAZolin (ANCEF) IV  2 g Intravenous Q8H  . dexamethasone  8 mg Intravenous Q12H  . famotidine (PEPCID) IV  20 mg Intravenous Q12H  . fentaNYL  50 mcg Transdermal Q72H  . heparin subcutaneous  5,000 Units Subcutaneous Q8H    Continuous Infusions: . sodium chloride 100 mL/hr at 09/25/15 0128    PRN Meds: acetaminophen **OR** acetaminophen, fentaNYL (SUBLIMAZE) injection, HYDROcodone-acetaminophen, ondansetron **OR** ondansetron (ZOFRAN) IV  Physical Exam         Eyes: PERRL, lids and conjunctivae normal ENMT:  Lip swelling resolved Neck: normal, supple, no masses, no thyromegaly Respiratory: clear to auscultation bilaterally, no wheezing, no crackles. Normal respiratory effort. No accessory muscle use.  Cardiovascular: Regular rate and rhythm, no murmurs / rubs /  gallops.No carotid bruits.  Abdomen: no tenderness, no masses palpated. No hepatosplenomegaly. Bowel sounds positive.  Musculoskeletal: no clubbing / cyanosis.  Bilateral LE edema worse left  Skin: blisters on L foot  Psychiatric: Normal judgment and insight. Alert and conversational.  Vital Signs: BP 178/92 mmHg  Pulse 79  Temp(Src) 98.4 F (36.9 C) (Oral)  Resp 16  Ht 5\' 11"  (1.803 m)  Wt 92.2 kg (203 lb 4.2 oz)  BMI 28.36 kg/m2  SpO2 98% SpO2: SpO2: 98 % O2 Device: O2 Device: Not Delivered O2 Flow Rate: O2 Flow Rate (L/min): 3 L/min  Intake/output summary:   Intake/Output Summary (Last 24 hours) at 09/25/15  [id number] Last data filed at 09/25/15 [id number]  Gross per 24 hour  Intake    720 ml  Output   2450 ml  Net  -1730 ml   LBM: Last BM Date: 09/24/15 Baseline Weight: Weight: 86.183 kg (190 lb) Most recent weight: Weight: 92.2 kg (203 lb 4.2 oz)       Palliative Assessment/Data: 40%     Patient Active Problem List   Diagnosis Date Noted  . Acute confusional state   . Neoplasm related pain   . Sepsis ([place])   . Palliative care encounter   . Goals of care, counseling/discussion   . Staphylococcus aureus bacteremia with sepsis ([place])   . Septic shock ([place])   . Leg pain, right   . Pleural effusion   . Infected venous access port   . Hypotension 09/19/2015  . Right thigh pain   . 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 Cancer associated pain   . Arterial hypotension   . Lactic acid acidosis     Palliative Care Assessment & Plan   Recommendations/Plan:  - Goal remains to treat treatable conditions while understanding that he has progressing deterioration from cancer.  He would like to continue antibiotics, but is clear that he does not want to escalate care (No pressors, intubation, CPR) in event of decompensation. - Pain: Improved.   continue bolus fentanyl as needed. - Confusion: significantly resolved.    - Constipation:  Bowel regimen to continue  Code  Status:    Code Status Orders        Start     Ordered   09/19/15 1609  Do not attempt resuscitation (DNR)   Continuous    Question Answer Comment  In the event of cardiac or respiratory ARREST Do not call a "code blue"   In the event of cardiac or respiratory ARREST Do not perform Intubation, CPR, defibrillation or ACLS   In the event of cardiac or respiratory ARREST Use medication by any route, position, wound care, and other measures to relive pain and suffering. May use oxygen, suction and manual treatment of airway obstruction as needed for comfort.      09/19/15 1608    Code Status History    Date Active Date Inactive Code Status Order ID Comments User Context   09/19/2015 12:37 PM 09/19/2015  4:08 PM DNR [id number]  Donita Brooks, NP ED       Prognosis: Unable to determine,    Discharge Planning:  To Be Determined  Care plan was discussed with patient  Thank you for allowing the Palliative Medicine Team to assist in the care of this patient.   Time In: 8 Time Out: 825 Total Time 25 Prolonged Time Billed No      Greater than 50%  of this time was spent counseling and coordinating care related to the above assessment and plan.  Loistine Chance, MD [phone]  Please contact Palliative Medicine Team phone at [phone] for questions and concerns.

## 2015-09-25 NOTE — Clinical Social Work Note (Signed)
Clinical Social Work Assessment  Patient Details  Name: Martin Norton MRN: [id number] Date of Birth: 1955/03/24  Date of referral:  09/25/15               Reason for consult:  Discharge Planning                Permission sought to share information with:  Chartered certified accountant granted to share information::  Yes, Verbal Permission Granted  Name::        Agency::     Relationship::     Contact Information:     Housing/Transportation Living arrangements for the past 2 months:  Single Family Home Source of Information:  Patient, Spouse Patient Interpreter Needed:  None Criminal Activity/Legal Involvement Pertinent to Current Situation/Hospitalization:    Significant Relationships:  Significant Other [name]) Lives with:  Significant Other Do you feel safe going back to the place where you live?  No Need for family participation in patient care:  Yes (Comment)  Care giving concerns:  CSW received consult to speak with pt regarding discharge plans.    Social Worker assessment / plan:  CSW spoke with pt and pt's spouse at bedside regarding discharge plans. Pt's is currently hoping to go to [facility] once discharged to receive rehab but understood the need for a back up plan at SNF. Pt and pt's spouse spoke about their need for SNF and the possibility for long-term care because pt has been declining. Pt agreed that he would like to find a facility that has long-term care. Both pt and pt's spouse had few questions about SNF and seemed more concerned about pt's comfort in the future.   Employment status:  Retired Surveyor, [employer] Care PT Recommendations:  [place] / Referral to community resources:  [place]  Patient/Family's Response to care:  Pt and pt's spouse were appreciative of CSW.    Patient/Family's Understanding of and Emotional Response to Diagnosis, Current Treatment, and Prognosis:  Pt and pt's spouse  understood the need for a back up plan if CIR does not have a bed available. Pt and pt's spouse mentioned the importance of comfort in the future.   Emotional Assessment Appearance:  Appears stated age Attitude/Demeanor/Rapport:    Affect (typically observed):  Happy, Hopeful, Accepting Orientation:  Oriented to Self, Oriented to Place, Oriented to  Time, Oriented to Situation Alcohol / Substance use:    Psych involvement (Current and /or in the community):  No (Comment)  Discharge Needs  Concerns to be addressed:  No discharge needs identified Readmission within the last 30 days:  No Current discharge risk:  None Barriers to Discharge:  No Barriers Identified   [name], LCSW 09/25/2015, 10:26 AM

## 2015-09-25 NOTE — Consult Note (Signed)
WOC wound consult note Reason for Consult: left foot blisters, ruptured and serum filled, the largest ruptured is on the left dorsal foot and measures 3cm x 2.4cm x 0.1cm, the largest intact measures 1cm x 2cm and is at the base of the third toe of the left foot.  There is a deep tissue pressure injury on the right lateral malleolus measuring 1cm x 1.4cm that is deep maroon/purple. Skin is intact, there is no warmth or induration.  Bilateral LEs are edematous and the LLE at the foot is weeping from fluid excess. Wound type:vensou insufficiency Pressure Ulcer POA: Yes Measurement:As noted above Wound [id number] blisters reveal moist, pink wound beds with periwound maceration Drainage (amount, consistency, odor) serous, no odor Periwound:maceration Dressing procedure/placement/frequency: I will implement bismuth and petrolatum dressing (xeroform) twice daily, padded with ABD and secured with kerlix roll gauze.  Feet are to be place in Prevlon heel boots for elevation of heel, pressure redistribution for right malleolus and general alignment of feet. [place] nursing team will not follow, but will remain available to this patient, the nursing and medical teams.  Please re-consult if needed. Thanks, Maudie Flakes, MSN, RN, [place], Arther Abbott  Pager# [phone]

## 2015-09-25 NOTE — Telephone Encounter (Signed)
error 

## 2015-09-25 NOTE — Evaluation (Signed)
Occupational Therapy Evaluation Patient Details Name: Martin Norton MRN: [id number] DOB: 09/08/1955 Today's Date: 09/25/2015    History of Present Illness 60 yo male admitted with septic shock, MSSA bacteremia, R thigh pain. Hx of DM, HTN, lymphoma, chronic lymphedema, chronic pain   Clinical Impression   Pt admitted with septic shock. Pt currently with functional limitations due to the deficits listed below (see OT Problem List).  Pt will benefit from skilled OT to increase their safety and independence with ADL and functional mobility for ADL to facilitate discharge to venue listed below.      Follow Up Recommendations  CIR    Equipment Recommendations  None recommended by OT       Precautions / Restrictions Precautions Precautions: Fall      Mobility Bed Mobility Overal bed mobility: Needs Assistance Bed Mobility: Supine to Sit     Supine to sit: Mod assist;HOB elevated     General bed mobility comments: Assist for trunk and bil LE. Increased time.   Transfers Overall transfer level: Needs assistance Equipment used: Rolling walker (2 wheeled) Transfers: Sit to/from Stand Sit to Stand: Mod assist;From elevated surface;Max assist                   ADL Overall ADL's : Needs assistance/impaired Eating/Feeding: Minimal assistance;Sitting   Grooming: Minimal assistance;Sitting   Upper Body Bathing: Minimal assitance;Sitting   Lower Body Bathing: Maximal assistance;Sit to/from stand;+2 for physical assistance;+2 for safety/equipment   Upper Body Dressing : Minimal assistance;Sitting   Lower Body Dressing: Total assistance;Sit to/from stand;+2 for physical assistance;+2 for safety/equipment                 General ADL Comments: Pt with good participation with OT but did want to return to bed and not chair. Pt agreed to get up to chair later in day.  Pt did perform several sit to stands with OT               Pertinent Vitals/Pain Pain Score: 5   Pain Location: R thigh Pain Descriptors / Indicators: Sore;Aching;Throbbing Pain Intervention(s): Monitored during session;Repositioned     Hand Dominance     Extremity/Trunk Assessment Upper Extremity Assessment Upper Extremity Assessment: Generalized weakness           Communication Communication Communication: No difficulties   Cognition Arousal/Alertness: Awake/alert Behavior During Therapy: WFL for tasks assessed/performed Overall Cognitive Status: Within Functional Limits for tasks assessed                                Home Living Family/patient expects to be discharged to:: Private residence Living Arrangements: Spouse/significant other   Type of Home: House Home Access: Stairs to enter Secretary/administratorntrance Stairs-Number of Steps: 3 Entrance Stairs-Rails: Right Home Layout: Multi-level     Bathroom Shower/Tub: Walk-in shower;Tub/shower unit         Home Equipment: Gilmer MorCane - single point;Walker - standard   Additional Comments: inflatable mattress on 1st level      Prior Functioning/Environment Level of Independence: Independent with assistive device(s)        Comments: using cane for ambulation prior to admission for ~2 weeks    OT Diagnosis: Generalized weakness;Acute pain   OT Problem List: Decreased strength;Decreased activity tolerance;Pain   OT Treatment/Interventions: Self-care/ADL training;Patient/family education;DME and/or AE instruction    OT Goals(Current goals can be found in the care plan section) Acute Rehab OT Goals Patient Stated Goal:  to regain strength and stop falling OT Goal Formulation: With patient Time For Goal Achievement: 10/09/15 Potential to Achieve Goals: Good  OT Frequency: Min 3X/week              End of Session Equipment Utilized During Treatment: Rolling walker Nurse Communication: Mobility status  Activity Tolerance: Patient tolerated treatment well Patient left: in bed;with call bell/phone within  reach;with bed alarm set   Time: [id number] OT Time Calculation (min): 33 min Charges:  OT General Charges $OT Visit: 1 Procedure OT Evaluation $OT Eval Moderate Complexity: 1 Procedure OT Treatments $Self Care/Home Management : 8-22 mins G-Codes:    Payton Mccallum D 10/04/2015, 11:31 AM

## 2015-09-25 NOTE — Progress Notes (Signed)
INFECTIOUS DISEASE PROGRESS NOTE  ID: Martin Norton is a 60 y.o. male with  Active Problems:   Right thigh pain   Diffuse large B-cell lymphoma (HCC)   Arterial hypotension   Lactic acid acidosis   Hypotension   Staphylococcus aureus bacteremia with sepsis (HCC)   Septic shock (HCC)   Leg pain, right   Pleural effusion   Infected venous access port   Palliative care encounter   Goals of care, counseling/discussion   Sepsis ([place])   Acute confusional state   Neoplasm related pain   Staph aureus infection   Essential hypertension, benign   Lymphedema  Subjective: C/o abd cramping due to need to move bowels.   Abtx:  Anti-infectives    Start     Dose/Rate Route Frequency Ordered Stop   09/25/15 1000  ceFAZolin (ANCEF) IVPB 2g/100 mL premix     2 g 200 mL/hr over 30 Minutes Intravenous Every 8 hours 09/25/15 0741     09/24/15 1800  ceFAZolin (ANCEF) IVPB 1 g/50 mL premix  Status:  Discontinued     1 g 100 mL/hr over 30 Minutes Intravenous Every 8 hours 09/24/15 1717 09/25/15 0741   09/20/15 1000  vancomycin (VANCOCIN) 1,250 mg in sodium chloride 0.9 % 250 mL IVPB  Status:  Discontinued     1,250 mg 166.7 mL/hr over 90 Minutes Intravenous Every 12 hours 09/20/15 0929 09/24/15 1717   09/19/15 2359  aztreonam (AZACTAM) 1 g in dextrose 5 % 50 mL IVPB  Status:  Discontinued     1 g 100 mL/hr over 30 Minutes Intravenous Every 8 hours 09/19/15 1445 09/19/15 2233   09/19/15 2200  linezolid (ZYVOX) IVPB 600 mg  Status:  Discontinued     600 mg 300 mL/hr over 60 Minutes Intravenous Every 12 hours 09/19/15 1419 09/20/15 0841   09/19/15 1800  vancomycin (VANCOCIN) IVPB 1000 mg/200 mL premix  Status:  Discontinued     1,000 mg 200 mL/hr over 60 Minutes Intravenous Every 12 hours 09/19/15 1046 09/19/15 1347   09/19/15 1800  piperacillin-tazobactam (ZOSYN) IVPB 3.375 g  Status:  Discontinued     3.375 g 12.5 mL/hr over 240 Minutes Intravenous Every 8 hours 09/19/15 1046 09/19/15 1347    09/19/15 1800  levofloxacin (LEVAQUIN) IVPB 750 mg  Status:  Discontinued     750 mg 100 mL/hr over 90 Minutes Intravenous Every 24 hours 09/19/15 1446 09/19/15 2233   09/19/15 1600  aztreonam (AZACTAM) 2 g in dextrose 5 % 50 mL IVPB     2 g 100 mL/hr over 30 Minutes Intravenous  Once 09/19/15 1445 09/19/15 1815   09/19/15 0900  piperacillin-tazobactam (ZOSYN) IVPB 3.375 g     3.375 g 100 mL/hr over 30 Minutes Intravenous  Once 09/19/15 0855 09/19/15 1014   09/19/15 0900  vancomycin (VANCOCIN) IVPB 1000 mg/200 mL premix     1,000 mg 200 mL/hr over 60 Minutes Intravenous  Once 09/19/15 0855 09/19/15 1112      Medications:  Scheduled: .  ceFAZolin (ANCEF) IV  2 g Intravenous Q8H  . famotidine  10 mg Oral BID  . fentaNYL  50 mcg Transdermal Q72H  . [START ON 09/26/2015] furosemide  40 mg Oral Daily  . heparin subcutaneous  5,000 Units Subcutaneous Q8H  . lisinopril  10 mg Oral Daily  . polyethylene glycol  17 g Oral Daily    Objective: Vital signs in last 24 hours: Temp:  [98.1 F (36.7 C)-99.3 F (37.4 C)] 98.1 F (  36.7 C) (07/12 1522) Pulse Rate:  [55-79] 72 (07/12 1522) Resp:  [15-16] 15 (07/12 1522) BP: (143-178)/(75-106) 143/106 mmHg (07/12 1522) SpO2:  [97 %-99 %] 99 % (07/12 1522)   General appearance: alert, cooperative and moderate distress Resp: clear to auscultation bilaterally Cardio: regular rate and rhythm GI: normal findings: bowel sounds normal and soft, non-tender Extremities: edema anasarca, blistering unchanged.   Lab Results  Recent Labs  09/24/15 0325  WBC 10.5  HGB 9.5*  HCT 28.5*  NA 131*  K 4.5  CL 100*  CO2 23  BUN 26*  CREATININE 0.81   Liver Panel  Recent Labs  09/24/15 0325  PROT 6.1*  ALBUMIN 3.0*  AST 43*  ALT 83*  ALKPHOS 62  BILITOT 0.8   Sedimentation Rate No results for input(s): ESRSEDRATE in the last 72 hours. C-Reactive Protein No results for input(s): CRP in the last 72 hours.  Microbiology: Recent  Results (from the past 240 hour(s))  Blood culture (routine x 2)     Status: Abnormal   Collection Time: 09/19/15  8:40 AM  Result Value Ref Range Status   Specimen Description BLOOD LEFT ANTECUBITAL  Final   Special Requests BOTTLES DRAWN AEROBIC AND ANAEROBIC 5ML  Final   Culture  Setup Time   Final    GRAM POSITIVE COCCI IN CLUSTERS IN BOTH AEROBIC AND ANAEROBIC BOTTLES CRITICAL RESULT CALLED TO, READ BACK BY AND VERIFIED WITH: PHARM L POWELL AT 2159 [id number] [place] Performed at [place] (A)  Final   Report Status 09/22/2015 FINAL  Final   Organism ID, Bacteria STAPHYLOCOCCUS AUREUS  Final      Susceptibility   Staphylococcus aureus - MIC*    CIPROFLOXACIN <=0.5 SENSITIVE Sensitive     ERYTHROMYCIN 4 RESISTANT Resistant     GENTAMICIN <=0.5 SENSITIVE Sensitive     OXACILLIN 0.5 SENSITIVE Sensitive     TETRACYCLINE <=1 SENSITIVE Sensitive     VANCOMYCIN <=0.5 SENSITIVE Sensitive     TRIMETH/SULFA <=10 SENSITIVE Sensitive     CLINDAMYCIN <=0.25 RESISTANT Resistant     RIFAMPIN <=0.5 SENSITIVE Sensitive     Inducible Clindamycin POSITIVE Resistant     * STAPHYLOCOCCUS AUREUS  Blood Culture ID Panel (Reflexed)     Status: Abnormal   Collection Time: 09/19/15  8:40 AM  Result Value Ref Range Status   Enterococcus species NOT DETECTED NOT DETECTED Final   Vancomycin resistance NOT DETECTED NOT DETECTED Final   Listeria monocytogenes NOT DETECTED NOT DETECTED Final   Staphylococcus species DETECTED (A) NOT DETECTED Final    Comment: CRITICAL RESULT CALLED TO, READ BACK BY AND VERIFIED WITH: PHARM L POWELL AT 2159 [id number] MARTINB    Staphylococcus aureus DETECTED (A) NOT DETECTED Final    Comment: CRITICAL RESULT CALLED TO, READ BACK BY AND VERIFIED WITH: PHARM L POWELL AT 2159 [id number] MARTINB    Methicillin resistance NOT DETECTED NOT DETECTED Final   Streptococcus species NOT DETECTED NOT DETECTED Final   Streptococcus agalactiae  NOT DETECTED NOT DETECTED Final   Streptococcus pneumoniae NOT DETECTED NOT DETECTED Final   Streptococcus pyogenes NOT DETECTED NOT DETECTED Final   Acinetobacter baumannii NOT DETECTED NOT DETECTED Final   Enterobacteriaceae species NOT DETECTED NOT DETECTED Final   Enterobacter cloacae complex NOT DETECTED NOT DETECTED Final   Escherichia coli NOT DETECTED NOT DETECTED Final   Klebsiella oxytoca NOT DETECTED NOT DETECTED Final   Klebsiella pneumoniae NOT DETECTED NOT DETECTED Final  Proteus species NOT DETECTED NOT DETECTED Final   Serratia marcescens NOT DETECTED NOT DETECTED Final   Carbapenem resistance NOT DETECTED NOT DETECTED Final   Haemophilus influenzae NOT DETECTED NOT DETECTED Final   Neisseria meningitidis NOT DETECTED NOT DETECTED Final   Pseudomonas aeruginosa NOT DETECTED NOT DETECTED Final   Candida albicans NOT DETECTED NOT DETECTED Final   Candida glabrata NOT DETECTED NOT DETECTED Final   Candida krusei NOT DETECTED NOT DETECTED Final   Candida parapsilosis NOT DETECTED NOT DETECTED Final   Candida tropicalis NOT DETECTED NOT DETECTED Final  Blood culture (routine x 2)     Status: Abnormal   Collection Time: 09/19/15  9:10 AM  Result Value Ref Range Status   Specimen Description BLOOD LEFT HAND  Final   Special Requests IN PEDIATRIC BOTTLE 2ML  Final   Culture  Setup Time   Final    GRAM POSITIVE COCCI IN CLUSTERS AEROBIC BOTTLE ONLY CRITICAL RESULT CALLED TO, READ BACK BY AND VERIFIED WITH: Lavell Luster [facility] 2221 09/19/15 A BROWNING    Culture (A)  Final    STAPHYLOCOCCUS AUREUS SUSCEPTIBILITIES PERFORMED ON PREVIOUS CULTURE WITHIN THE LAST 5 DAYS. Performed at [facility]    Report Status 09/22/2015 FINAL  Final  Urine culture     Status: None   Collection Time: 09/19/15 11:56 AM  Result Value Ref Range Status   Specimen Description URINE, CLEAN CATCH  Final   Special Requests NONE  Final   Culture NO GROWTH Performed at [facility]    Final   Report Status 09/20/2015 FINAL  Final  Culture, blood (Routine X 2) w Reflex to ID Panel     Status: Abnormal   Collection Time: 09/20/15  9:15 AM  Result Value Ref Range Status   Specimen Description BLOOD LEFT HAND  Final   Special Requests IN PEDIATRIC BOTTLE 4CC  Final   Culture  Setup Time   Final    GRAM POSITIVE COCCI IN CLUSTERS AEROBIC BOTTLE ONLY CRITICAL RESULT CALLED TO, READ BACK BY AND VERIFIED WITH: A RUNYON 09/21/15 @ 0751 M VESTAL    Culture (A)  Final    STAPHYLOCOCCUS AUREUS SUSCEPTIBILITIES PERFORMED ON PREVIOUS CULTURE WITHIN THE LAST 5 DAYS. Performed at [facility]    Report Status 09/22/2015 FINAL  Final  Culture, blood (Routine X 2) w Reflex to ID Panel     Status: Abnormal   Collection Time: 09/20/15  9:15 AM  Result Value Ref Range Status   Specimen Description BLOOD LEFT HAND  Final   Special Requests IN PEDIATRIC BOTTLE 3CC  Final   Culture  Setup Time   Final    GRAM POSITIVE COCCI IN CLUSTERS AEROBIC BOTTLE ONLY CRITICAL RESULT CALLED TO, READ BACK BY AND VERIFIED WITH: A RUNYON 09/21/15 @ 0751 M VESTAL    Culture (A)  Final    STAPHYLOCOCCUS AUREUS SUSCEPTIBILITIES PERFORMED ON PREVIOUS CULTURE WITHIN THE LAST 5 DAYS. Performed at [facility]    Report Status 09/22/2015 FINAL  Final  Anaerobic culture     Status: None   Collection Time: 09/21/15  7:30 AM  Result Value Ref Range Status   Specimen Description PORTA CATH  Final   Special Requests NONE  Final   Culture   Final    SPECIMEN/CONTAINER TYPE INAPPROPRIATE FOR ORDERED TEST, UNABLE TO Arlis Porta, RN AT 1103 09/23/15 BY L BENFIELD Performed at [facility]    Report Status 09/23/2015 FINAL  Final  Surgical pcr screen     Status: Abnormal   Collection Time: 09/21/15  7:36 AM  Result Value Ref Range Status   MRSA, PCR NEGATIVE NEGATIVE Final   Staphylococcus aureus POSITIVE (A) NEGATIVE Final    Comment:        The Xpert SA Assay (FDA approved for  NASAL specimens in patients over 59 years of age), is one component of a comprehensive surveillance program.  Test performance has been validated by [facility] for patients greater than or equal to 38 year old. It is not intended to diagnose infection nor to guide or monitor treatment.   Culture, blood (routine x 2)     Status: None (Preliminary result)   Collection Time: 09/21/15  2:36 PM  Result Value Ref Range Status   Specimen Description BLOOD RIGHT ANTECUBITAL  Final   Special Requests BOTTLES DRAWN AEROBIC AND ANAEROBIC 10CC  Final   Culture   Final    NO GROWTH 4 DAYS Performed at [facility]    Report Status PENDING  Incomplete  Culture, blood (routine x 2)     Status: None (Preliminary result)   Collection Time: 09/21/15  2:36 PM  Result Value Ref Range Status   Specimen Description BLOOD RIGHT HAND  Final   Special Requests BOTTLES DRAWN AEROBIC ONLY [place]  Final   Culture   Final    NO GROWTH 4 DAYS Performed at [facility]    Report Status PENDING  Incomplete    Studies/Results: No results found.   Assessment/Plan: MSSA bacteremia Porta-cath (removed) LV nodule- not felt to be IE on TEE Large Cell B Lymphoma PEN (zosyn) allergy  TEE no vegetation. Repeat BCx 7-8 are ngtd Would aim for 2 weeks of ancef Suspect blisters are due to edema.  Nursing helping pt with BM  Please repeat BCx 1 week after he completes atbx  Available as needed.   Total days of antibiotics:  7-6 vanco  7-11 7-11 ancef --> 7-25         Bobby Rumpf Infectious Diseases (pager) [phone] [email or web address] 09/25/2015, 4:27 PM  LOS: 5 days

## 2015-09-25 NOTE — Progress Notes (Signed)
I spoke with pt by phone and his girlfriend, [name] with his permission. We discussed overall goals for an inpt rehab admission as well as the patient's overall goals moving forward. Noted palliative meetings and discussions with pt's oncologist at [facility] with prognosis discussed. Patient and girlfriend are looking for prolonged rehab to focus on his medical, therapy and comfort needs. I explained that it is unlikely that his BCBS would pay for intensive rehab admission as well as SNF. Given his overall longterm needs, I have recommended SNF . I have alerted RN CM and will also alert SW. I will sign off. Please contact me with any questions or concerns. [phone]

## 2015-09-25 NOTE — Clinical Social Work Placement (Signed)
   CLINICAL SOCIAL WORK PLACEMENT  NOTE  Date:  09/25/2015  Patient Details  Name: Martin Norton MRN: [id number] Date of Birth: [birth year]  Clinical Social Work is seeking post-discharge placement for this patient at the [place] level of care (*CSW will initial, date and re-position this form in  chart as items are completed):  Yes   Patient/family provided with [facility] Work Department's list of facilities offering this level of care within the geographic area requested by the patient (or if unable, by the patient's family).  Yes   Patient/family informed of their freedom to choose among providers that offer the needed level of care, that participate in Medicare, Medicaid or managed care program needed by the patient, have an available bed and are willing to accept the patient.  Yes   Patient/family informed of [facility]'s ownership interest in [facility] and [facility], as well as of the fact that they are under no obligation to receive care at these facilities.  PASRR submitted to EDS on       PASRR number received on       Existing PASRR number confirmed on 09/24/15     FL2 transmitted to all facilities in geographic area requested by pt/family on 09/24/15     FL2 transmitted to all facilities within larger geographic area on       Patient informed that his/her managed care company has contracts with or will negotiate with certain facilities, including the following:            Patient/family informed of bed offers received.  Patient chooses bed at       Physician recommends and patient chooses bed at      Patient to be transferred to   on  .  Patient to be transferred to facility by       Patient family notified on   of transfer.  Name of family member notified:        PHYSICIAN       Additional Comment:    _______________________________________________ [name], LCSW 09/25/2015, 10:20 AM

## 2015-09-25 NOTE — Progress Notes (Signed)
This CM was alerted by CIR rep that CIR is not appropriate for pt. CSW and MD alerted of information. Marney Doctor RN,BSN,NCM [phone]

## 2015-09-25 NOTE — Progress Notes (Signed)
PROGRESS NOTE                                                                                                                                                                                                             Patient Demographics:    Martin Norton, is a 60 y.o. male, DOB - 03-Mar-1956, [id number]:[id number]  Admit date - 09/19/2015   Admitting Physician Elmarie Shiley, MD  Outpatient Primary MD for the patient is SELTZER, Desiree Hane, MD  LOS - 5  Outpatient Specialists: oncologist at [facility]  Chief Complaint  Patient presents with  . Leg Pain       Brief Narrative  60 year old male with history of hypertension, diabetes mellitus, refractory diffuse large B-cell lymphoma treated at [facility] with chronic lymphedema presented with worsening leg pain. Ace and was found to have lactic acidosis, subsequently became hypotensive in the ED. He was found to be septic and admitted to stepdown unit with aggressive IV hydration and empiric antibiotics. Blood cultures grew MSSA.    Subjective:   Patient reports feeling tired, denies further pain.   Assessment  & Plan :   Principal problem  Staphylococcus aureus bacteremia with sepsis ([place]) Sepsis  resolved. Discontinue Decadron and IV fluids. TEE negative for vegetation. Repeat blood cultures without further growth. Antibiotic switched to Ancef with plan on 2 weeks course. Will order for PICC line if patient going to skilled nursing facility. Patient's Port-A-Cath has been removed.   Active Problems:  Right leg pain Suspected due to lymphoma deep to the right gluteus. On fentanyl patch and when necessary Vicodin with good response.    Diffuse large B-cell lymphoma ([place]) Follows with oncologist at [facility]. Patient undecided if he wants to go back there for follow-up with oncologist in [place].  Chronic lymphedema. Added Lasix.  Essential  hypertension Hypotension with sepsis resolved. Resumed his losartan. Added Lasix.  Type 2 diabetes mellitus Hold metformin. Sliding scale coverage.  Hyponatremia Possibly due to dehydration and sepsis. Monitor closely.   Code Status : full code  Family Communication  : none at bedside  Disposition Plan  : CIR versus SNF. Ready for discharge by 7/13  Barriers For Discharge : CIR evalaution  Consults  :   ID PC CM  Procedures  :  MRI right femur  DVT Prophylaxis  :  Lovenox -   Lab Results  Component Value Date   PLT 162 09/24/2015    Antibiotics  :   Anti-infectives    Start     Dose/Rate Route Frequency Ordered Stop   09/25/15 1000  ceFAZolin (ANCEF) IVPB 2g/100 mL premix     2 g 200 mL/hr over 30 Minutes Intravenous Every 8 hours 09/25/15 0741     09/24/15 1800  ceFAZolin (ANCEF) IVPB 1 g/50 mL premix  Status:  Discontinued     1 g 100 mL/hr over 30 Minutes Intravenous Every 8 hours 09/24/15 1717 09/25/15 0741   09/20/15 1000  vancomycin (VANCOCIN) 1,250 mg in sodium chloride 0.9 % 250 mL IVPB  Status:  Discontinued     1,250 mg 166.7 mL/hr over 90 Minutes Intravenous Every 12 hours 09/20/15 0929 09/24/15 1717   09/19/15 2359  aztreonam (AZACTAM) 1 g in dextrose 5 % 50 mL IVPB  Status:  Discontinued     1 g 100 mL/hr over 30 Minutes Intravenous Every 8 hours 09/19/15 1445 09/19/15 2233   09/19/15 2200  linezolid (ZYVOX) IVPB 600 mg  Status:  Discontinued     600 mg 300 mL/hr over 60 Minutes Intravenous Every 12 hours 09/19/15 1419 09/20/15 0841   09/19/15 1800  vancomycin (VANCOCIN) IVPB 1000 mg/200 mL premix  Status:  Discontinued     1,000 mg 200 mL/hr over 60 Minutes Intravenous Every 12 hours 09/19/15 1046 09/19/15 1347   09/19/15 1800  piperacillin-tazobactam (ZOSYN) IVPB 3.375 g  Status:  Discontinued     3.375 g 12.5 mL/hr over 240 Minutes Intravenous Every 8 hours 09/19/15 1046 09/19/15 1347   09/19/15 1800  levofloxacin (LEVAQUIN) IVPB 750 mg   Status:  Discontinued     750 mg 100 mL/hr over 90 Minutes Intravenous Every 24 hours 09/19/15 1446 09/19/15 2233   09/19/15 1600  aztreonam (AZACTAM) 2 g in dextrose 5 % 50 mL IVPB     2 g 100 mL/hr over 30 Minutes Intravenous  Once 09/19/15 1445 09/19/15 1815   09/19/15 0900  piperacillin-tazobactam (ZOSYN) IVPB 3.375 g     3.375 g 100 mL/hr over 30 Minutes Intravenous  Once 09/19/15 0855 09/19/15 1014   09/19/15 0900  vancomycin (VANCOCIN) IVPB 1000 mg/200 mL premix     1,000 mg 200 mL/hr over 60 Minutes Intravenous  Once 09/19/15 0855 09/19/15 1112        Objective:   Filed Vitals:   09/24/15 1236 09/24/15 1400 09/24/15 2048 09/25/15 0619  BP: 159/106 160/110 147/75 178/92  Pulse: 80 82 55 79  Temp: 97.7 F (36.5 C) 97.7 F (36.5 C) 99.3 F (37.4 C) 98.4 F (36.9 C)  TempSrc: Oral Oral Oral Oral  Resp: 20 19 16 16   Height:      Weight:      SpO2: 99% 100% 97% 98%    Wt Readings from Last 3 Encounters:  09/19/15 92.2 kg (203 lb 4.2 oz)  07/08/14 90.719 kg (200 lb)     Intake/Output Summary (Last 24 hours) at 09/25/15 1317 Last data filed at 09/25/15 1200  Gross per 24 hour  Intake    960 ml  Output   3625 ml  Net  -2665 ml     Physical Exam  Gen: not in distress HEENT: no pallor, moist mucosa, supple neck Chest: clear b/l, no added sounds CVS: N S1&S2, no murmurs, rubs or gallop GI: soft, NT, ND, BS+ Musculoskeletal: Warm, pitting edema bilaterally CNS: Alert and oriented  Data Review:    CBC  Recent Labs Lab 09/19/15 0416 09/20/15 0625 09/22/15 0416 09/24/15 0325  WBC 9.5 11.3* 8.2 10.5  HGB 10.2* 9.2* 8.8* 9.5*  HCT 29.4* 26.5* 25.6* 28.5*  PLT 194 135* 133* 162  MCV 88.6 89.5 87.4 89.3  MCH 30.7 31.1 30.0 29.8  MCHC 34.7 34.7 34.4 33.3  RDW 15.3 15.9* 15.3 15.1  LYMPHSABS 0.1*  --   --   --   MONOABS 0.2  --   --   --   EOSABS 0.0  --   --   --   BASOSABS 0.0  --   --   --     Chemistries   Recent Labs Lab 09/19/15 0416  09/19/15 0422 09/20/15 0625 09/21/15 2101 09/22/15 0416 09/24/15 0325  NA 129*  --  130*  --  130* 131*  K 3.6  --  4.6  --  4.2 4.5  CL 94*  --  101  --  101 100*  CO2 23  --  21*  --  23 23  GLUCOSE 72  --  128*  --  191* 201*  BUN 27*  --  32*  --  28* 26*  CREATININE 0.72  --  1.07 0.82 0.73 0.81  CALCIUM 9.9  --  8.4*  --  8.9 8.8*  AST  --  31  --   --   --  43*  ALT  --  25  --   --   --  83*  ALKPHOS  --  42  --   --   --  62  BILITOT  --  0.6  --   --   --  0.8   ------------------------------------------------------------------------------------------------------------------ No results for input(s): CHOL, HDL, LDLCALC, TRIG, CHOLHDL, LDLDIRECT in the last 72 hours.  No results found for: HGBA1C ------------------------------------------------------------------------------------------------------------------ No results for input(s): TSH, T4TOTAL, T3FREE, THYROIDAB in the last 72 hours.  Invalid input(s): FREET3 ------------------------------------------------------------------------------------------------------------------ No results for input(s): VITAMINB12, FOLATE, FERRITIN, TIBC, IRON, RETICCTPCT in the last 72 hours.  Coagulation profile No results for input(s): INR, PROTIME in the last 168 hours.  No results for input(s): DDIMER in the last 72 hours.  Cardiac Enzymes No results for input(s): CKMB, TROPONINI, MYOGLOBIN in the last 168 hours.  Invalid input(s): CK ------------------------------------------------------------------------------------------------------------------ No results found for: BNP  Inpatient Medications  Scheduled Meds: .  ceFAZolin (ANCEF) IV  2 g Intravenous Q8H  . dexamethasone  8 mg Intravenous Q12H  . famotidine  10 mg Oral BID  . fentaNYL  50 mcg Transdermal Q72H  . [START ON 09/26/2015] furosemide  40 mg Oral Daily  . heparin subcutaneous  5,000 Units Subcutaneous Q8H  . lisinopril  10 mg Oral Daily  . polyethylene glycol   17 g Oral Daily   Continuous Infusions:  PRN Meds:.acetaminophen **OR** acetaminophen, fentaNYL (SUBLIMAZE) injection, HYDROcodone-acetaminophen, ondansetron **OR** ondansetron (ZOFRAN) IV  Micro Results Recent Results (from the past 240 hour(s))  Blood culture (routine x 2)     Status: Abnormal   Collection Time: 09/19/15  8:40 AM  Result Value Ref Range Status   Specimen Description BLOOD LEFT ANTECUBITAL  Final   Special Requests BOTTLES DRAWN AEROBIC AND ANAEROBIC 5ML  Final   Culture  Setup Time   Final    GRAM POSITIVE COCCI IN CLUSTERS IN BOTH AEROBIC AND ANAEROBIC BOTTLES CRITICAL RESULT CALLED TO, READ BACK BY AND VERIFIED WITH: Lolita Rieger POWELL AT 2159 [id number] [place] Performed at [facility]  Culture STAPHYLOCOCCUS AUREUS (A)  Final   Report Status 09/22/2015 FINAL  Final   Organism ID, Bacteria STAPHYLOCOCCUS AUREUS  Final      Susceptibility   Staphylococcus aureus - MIC*    CIPROFLOXACIN <=0.5 SENSITIVE Sensitive     ERYTHROMYCIN 4 RESISTANT Resistant     GENTAMICIN <=0.5 SENSITIVE Sensitive     OXACILLIN 0.5 SENSITIVE Sensitive     TETRACYCLINE <=1 SENSITIVE Sensitive     VANCOMYCIN <=0.5 SENSITIVE Sensitive     TRIMETH/SULFA <=10 SENSITIVE Sensitive     CLINDAMYCIN <=0.25 RESISTANT Resistant     RIFAMPIN <=0.5 SENSITIVE Sensitive     Inducible Clindamycin POSITIVE Resistant     * STAPHYLOCOCCUS AUREUS  Blood Culture ID Panel (Reflexed)     Status: Abnormal   Collection Time: 09/19/15  8:40 AM  Result Value Ref Range Status   Enterococcus species NOT DETECTED NOT DETECTED Final   Vancomycin resistance NOT DETECTED NOT DETECTED Final   Listeria monocytogenes NOT DETECTED NOT DETECTED Final   Staphylococcus species DETECTED (A) NOT DETECTED Final    Comment: CRITICAL RESULT CALLED TO, READ BACK BY AND VERIFIED WITH: PHARM L POWELL AT 2159 [id number] MARTINB    Staphylococcus aureus DETECTED (A) NOT DETECTED Final    Comment: CRITICAL RESULT CALLED TO,  READ BACK BY AND VERIFIED WITH: PHARM L POWELL AT 2159 [id number] MARTINB    Methicillin resistance NOT DETECTED NOT DETECTED Final   Streptococcus species NOT DETECTED NOT DETECTED Final   Streptococcus agalactiae NOT DETECTED NOT DETECTED Final   Streptococcus pneumoniae NOT DETECTED NOT DETECTED Final   Streptococcus pyogenes NOT DETECTED NOT DETECTED Final   Acinetobacter baumannii NOT DETECTED NOT DETECTED Final   Enterobacteriaceae species NOT DETECTED NOT DETECTED Final   Enterobacter cloacae complex NOT DETECTED NOT DETECTED Final   Escherichia coli NOT DETECTED NOT DETECTED Final   Klebsiella oxytoca NOT DETECTED NOT DETECTED Final   Klebsiella pneumoniae NOT DETECTED NOT DETECTED Final   Proteus species NOT DETECTED NOT DETECTED Final   Serratia marcescens NOT DETECTED NOT DETECTED Final   Carbapenem resistance NOT DETECTED NOT DETECTED Final   Haemophilus influenzae NOT DETECTED NOT DETECTED Final   Neisseria meningitidis NOT DETECTED NOT DETECTED Final   Pseudomonas aeruginosa NOT DETECTED NOT DETECTED Final   Candida albicans NOT DETECTED NOT DETECTED Final   Candida glabrata NOT DETECTED NOT DETECTED Final   Candida krusei NOT DETECTED NOT DETECTED Final   Candida parapsilosis NOT DETECTED NOT DETECTED Final   Candida tropicalis NOT DETECTED NOT DETECTED Final  Blood culture (routine x 2)     Status: Abnormal   Collection Time: 09/19/15  9:10 AM  Result Value Ref Range Status   Specimen Description BLOOD LEFT HAND  Final   Special Requests IN PEDIATRIC BOTTLE 2ML  Final   Culture  Setup Time   Final    GRAM POSITIVE COCCI IN CLUSTERS AEROBIC BOTTLE ONLY CRITICAL RESULT CALLED TO, READ BACK BY AND VERIFIED WITH: Lavell Luster [facility] 2221 09/19/15 A BROWNING    Culture (A)  Final    STAPHYLOCOCCUS AUREUS SUSCEPTIBILITIES PERFORMED ON PREVIOUS CULTURE WITHIN THE LAST 5 DAYS. Performed at [facility]    Report Status 09/22/2015 FINAL  Final  Urine culture      Status: None   Collection Time: 09/19/15 11:56 AM  Result Value Ref Range Status   Specimen Description URINE, CLEAN CATCH  Final   Special Requests NONE  Final   Culture NO GROWTH Performed at  [facility]   Final   Report Status 09/20/2015 FINAL  Final  Culture, blood (Routine X 2) w Reflex to ID Panel     Status: Abnormal   Collection Time: 09/20/15  9:15 AM  Result Value Ref Range Status   Specimen Description BLOOD LEFT HAND  Final   Special Requests IN PEDIATRIC BOTTLE 4CC  Final   Culture  Setup Time   Final    GRAM POSITIVE COCCI IN CLUSTERS AEROBIC BOTTLE ONLY CRITICAL RESULT CALLED TO, READ BACK BY AND VERIFIED WITH: A RUNYON 09/21/15 @ 0751 M VESTAL    Culture (A)  Final    STAPHYLOCOCCUS AUREUS SUSCEPTIBILITIES PERFORMED ON PREVIOUS CULTURE WITHIN THE LAST 5 DAYS. Performed at [facility]    Report Status 09/22/2015 FINAL  Final  Culture, blood (Routine X 2) w Reflex to ID Panel     Status: Abnormal   Collection Time: 09/20/15  9:15 AM  Result Value Ref Range Status   Specimen Description BLOOD LEFT HAND  Final   Special Requests IN PEDIATRIC BOTTLE 3CC  Final   Culture  Setup Time   Final    GRAM POSITIVE COCCI IN CLUSTERS AEROBIC BOTTLE ONLY CRITICAL RESULT CALLED TO, READ BACK BY AND VERIFIED WITH: A RUNYON 09/21/15 @ 0751 M VESTAL    Culture (A)  Final    STAPHYLOCOCCUS AUREUS SUSCEPTIBILITIES PERFORMED ON PREVIOUS CULTURE WITHIN THE LAST 5 DAYS. Performed at [name]    Report Status 09/22/2015 FINAL  Final  Anaerobic culture     Status: None   Collection Time: 09/21/15  7:30 AM  Result Value Ref Range Status   Specimen Description PORTA CATH  Final   Special Requests NONE  Final   Culture   Final    SPECIMEN/CONTAINER TYPE INAPPROPRIATE FOR ORDERED TEST, UNABLE TO Arlis Porta, RN AT 1103 09/23/15 BY L BENFIELD Performed at [facility]    Report Status 09/23/2015 FINAL  Final  Surgical pcr screen     Status:  Abnormal   Collection Time: 09/21/15  7:36 AM  Result Value Ref Range Status   MRSA, PCR NEGATIVE NEGATIVE Final   Staphylococcus aureus POSITIVE (A) NEGATIVE Final    Comment:        The Xpert SA Assay (FDA approved for NASAL specimens in patients over 76 years of age), is one component of a comprehensive surveillance program.  Test performance has been validated by [facility] for patients greater than or equal to 64 year old. It is not intended to diagnose infection nor to guide or monitor treatment.   Culture, blood (routine x 2)     Status: None (Preliminary result)   Collection Time: 09/21/15  2:36 PM  Result Value Ref Range Status   Specimen Description BLOOD RIGHT ANTECUBITAL  Final   Special Requests BOTTLES DRAWN AEROBIC AND ANAEROBIC 10CC  Final   Culture   Final    NO GROWTH 3 DAYS Performed at [facility]    Report Status PENDING  Incomplete  Culture, blood (routine x 2)     Status: None (Preliminary result)   Collection Time: 09/21/15  2:36 PM  Result Value Ref Range Status   Specimen Description BLOOD RIGHT HAND  Final   Special Requests BOTTLES DRAWN AEROBIC ONLY [place]  Final   Culture   Final    NO GROWTH 3 DAYS Performed at [facility]    Report Status PENDING  Incomplete    Radiology  Reports Mr Femur Right W Wo Contrast  09/20/2015  CLINICAL DATA:  Right femur pain for 18 months. History of lymphoma. EXAM: MRI OF THE RIGHT FEMUR WITHOUT AND WITH CONTRAST TECHNIQUE: Multiplanar, multisequence MR imaging of the lower right extremity was performed both before and after administration of intravenous contrast. CONTRAST:  2mL MULTIHANCE GADOBENATE DIMEGLUMINE 529 MG/ML IV SOLN COMPARISON:  None. FINDINGS: There is an enhancing 3.6 x 5.1 x 2.9 cm soft tissue mass in the right buttock deep to and possibly involving the right gluteus maximus muscle immediately adjacent to the right sciatic nerve. The patient has extensive patchy edema throughout  muscles of both thighs, more prominent on the right than the left. There is asymmetric increased edema of the right adductor muscles of the right side of the pelvis. The femur appears normal. No discrete hip effusion. Moderate bilateral knee effusions. There is circumferential subcutaneous edema and both thighs and in the scrotum. IMPRESSION: 1. Soft tissue mass immediately adjacent to the right sciatic nerve posterior to the right acetabulum and deep to the right gluteus maximus muscle consistent with lymphoma. 2. Asymmetric patchy extensive edema in the muscles of both thighs, right greater than left. This is consistent with nonspecific myositis. Electronically Signed   By: Lorriane Shire M.D.   On: 09/20/2015 15:04   Dg Chest Port 1 View  09/19/2015  CLINICAL DATA:  Non-Hodgkin's lymphoma. EXAM: PORTABLE CHEST 1 VIEW COMPARISON:  None. FINDINGS: There is pleural thickening along the lateral upper right hemithorax, measuring 6.1 x 2.3 cm. There is no edema or consolidation. The heart is borderline enlarged with pulmonary vascularity within normal limits. Atherosclerotic calcification is noted in the aorta. No adenopathy is evident. There are no blastic or lytic bone lesions evident. Port-A-Cath tip is in the superior vena cava. No evident pneumothorax. There is calcification in the left carotid artery. IMPRESSION: Pleural thickening on the right laterally. Chronicity of this finding uncertain. Given history of lymphoma and this pleural thickening, correlation with contrast enhanced chest CT may be of value to further assess. No edema or consolidation. Heart borderline enlarged. There is aortic atherosclerosis. There is also left carotid artery calcification. No adenopathy is demonstrable by radiography. Electronically Signed   By: Lowella Grip III M.D.   On: 09/19/2015 09:30    Time Spent in minutes  25   Louellen Molder M.D on 09/25/2015 at 1:17 PM  Between 7am to 7pm - Pager -  [phone]  After 7pm go to [email or web address] - password [facility]  [facility] -  Office  [phone]

## 2015-09-25 NOTE — Progress Notes (Signed)
Physical Therapy Treatment Patient Details Name: Martin Norton MRN: [id number] DOB: 1955-11-02 Today's Date: 09/25/2015    History of Present Illness 60 yo male admitted with septic shock, MSSA bacteremia, R thigh pain. Hx of DM, HTN, lymphoma, chronic lymphedema, chronic pain    PT Comments    Pt c/o B LE increased edema and weeping blisters on B feet.  Assisted to EOB with increased time and assist for B LE.  Attempted amb using B platform EVA walker and + 2 assist however pt was unable to weight shift enough onto R LE to take any functional steps.  Returned to bed and performed some AAROM TE's.  Pt was unable to tolerate any TE's on R LE due to increased pain.  Positioned to comfort.   Follow Up Recommendations  SNF     Equipment Recommendations       Recommendations for Other Services       Precautions / Restrictions Precautions Precautions: Fall Precaution Comments: R LE weakness and B LE edema Restrictions Weight Bearing Restrictions: No    Mobility  Bed Mobility Overal bed mobility: Needs Assistance Bed Mobility: Supine to Sit;Sit to Supine     Supine to sit: Mod assist;HOB elevated Sit to supine: Mod assist   General bed mobility comments: Assist for trunk and bil LE. Increased time.   Transfers Overall transfer level: Needs assistance Equipment used: Rolling walker (2 wheeled) Transfers: Sit to/from Stand Sit to Stand: Mod assist;Max assist         General transfer comment: x2. Assist to rise, stabilize, control descent. Pt was allowed to pull up on B platform walker.  Ambulation/Gait             General Gait Details: used B platform EVA walker for increased support.  Pt was unable to weight shift enough onto R LE to functionally take steps due to pain and weakness.  B knees buckle.  Assisted back to bed.    Stairs            Wheelchair Mobility    Modified Rankin (Stroke Patients Only)       Balance                                    Cognition Arousal/Alertness: Awake/alert Behavior During Therapy: WFL for tasks assessed/performed Overall Cognitive Status: Within Functional Limits for tasks assessed                      Exercises  L LE AAROM TE's x 10 reps     General Comments        Pertinent Vitals/Pain Pain Assessment: 0-10 Pain Score: 7  Pain Location: R thigh and foot during standing Pain Descriptors / Indicators: Aching;Sore;Throbbing Pain Intervention(s): Monitored during session;Repositioned    Home Living                      Prior Function            PT Goals (current goals can now be found in the care plan section) Progress towards PT goals: Progressing toward goals    Frequency  Min 3X/week    PT Plan Current plan remains appropriate    Co-evaluation             End of Session Equipment Utilized During Treatment: Gait belt   Patient left: in bed;with call bell/phone within reach  Time: [id number] PT Time Calculation (min) (ACUTE ONLY): 25 min  Charges:  $Therapeutic Exercise: 8-22 mins $Therapeutic Activity: 8-22 mins                    G Codes:      Rica Koyanagi  PTA [facility]  Acute  Rehab Pager      [phone]

## 2015-09-26 ENCOUNTER — Inpatient Hospital Stay (HOSPITAL_COMMUNITY): Payer: BLUE CROSS/BLUE SHIELD

## 2015-09-26 DIAGNOSIS — B9561 Methicillin susceptible Staphylococcus aureus infection as the cause of diseases classified elsewhere: Secondary | ICD-10-CM

## 2015-09-26 DIAGNOSIS — R7881 Bacteremia: Secondary | ICD-10-CM

## 2015-09-26 LAB — CULTURE, BLOOD (ROUTINE X 2)
CULTURE: NO GROWTH
CULTURE: NO GROWTH

## 2015-09-26 LAB — CREATININE, SERUM
CREATININE: 0.64 mg/dL (ref 0.61–1.24)
GFR calc Af Amer: >60 mL/min (ref >60)
GFR calc non Af Amer: >60 mL/min (ref >60)

## 2015-09-26 MED ORDER — CEFAZOLIN SODIUM-DEXTROSE 2-4 GM/100ML-% IV SOLN
2.0000 g | Freq: Three times a day (TID) | INTRAVENOUS | Status: AC
Start: 2015-09-26 — End: 2015-10-08

## 2015-09-26 MED ORDER — OXYCODONE HCL 5 MG PO TABS: 5.0000 mg | ORAL_TABLET | ORAL | Status: AC | PRN

## 2015-09-26 MED ORDER — SODIUM CHLORIDE 0.9% FLUSH
10.0000 mL | INTRAVENOUS | Status: DC | PRN
Start: 2015-09-26 — End: 2015-09-27

## 2015-09-26 MED ORDER — SODIUM CHLORIDE 0.9% FLUSH
10.0000 mL | Freq: Two times a day (BID) | INTRAVENOUS | Status: DC
  Administered 2015-09-26: 20 mL
  Administered 2015-09-26: 10 mL

## 2015-09-26 MED ORDER — FUROSEMIDE 40 MG PO TABS: 40.0000 mg | ORAL_TABLET | Freq: Every day | ORAL | Status: AC

## 2015-09-26 MED ORDER — FENTANYL 25 MCG/HR TD PT72: 25.0000 ug | MEDICATED_PATCH | TRANSDERMAL | Status: DC

## 2015-09-26 NOTE — Progress Notes (Signed)
PROGRESS NOTE                                                                                                                                                                                                             Patient Demographics:    Martin Norton, is a 60 y.o. male, DOB - [birth year], XN:[id number]  Admit date - 09/19/2015   Admitting Physician Elmarie Shiley, MD  Outpatient Primary MD for the patient is Red Christians, MD  LOS - 6  Outpatient Specialists: oncologist at [facility]  Chief Complaint  Patient presents with  . Leg Pain       Brief Narrative  60 year old male with history of hypertension, diabetes mellitus, refractory diffuse large B-cell lymphoma treated at [facility] with chronic lymphedema presented with worsening leg pain. Ace and was found to have lactic acidosis, subsequently became hypotensive in the ED. He was found to be septic and admitted to stepdown unit with aggressive IV hydration and empiric antibiotics. Blood cultures grew MSSA.    Subjective:   No overnight issues. Feels better today.   Assessment  & Plan :   Principal problem  Staphylococcus aureus bacteremia with sepsis ([place]) Sepsis  resolved. Off Fluids. TEE negative for vegetation. Port-A-Cath removed. Repeat blood cultures without further growth. Antibiotic switched to Ancef with plan on 2 weeks course. PICC line placed. Stop date 7/25.   Active Problems:  Right leg pain Suspected due to lymphoma deep to the right gluteus. On fentanyl patch and when necessary Vicodin with good response. Also on low-dose Decadron as outpatient for pain which will be continued.    Diffuse large B-cell lymphoma ([place]) Follows with oncologist at [facility]. He should follow-up with his oncologist in 2 weeks after discharge.  Chronic lymphedema. Likely contributed by his lymphoma. Added daily Lasix.  Essential  hypertension Hypotension with sepsis resolved. Resumed his losartan. Added Lasix.  Type 2 diabetes mellitus Hold metformin. Sliding scale coverage.  Hyponatremia Possibly due to dehydration and sepsis. Monitor   Code Status : full code  Family Communication  : Friend at bedside  Disposition Plan  : SNF possibly on 7/14.  Barriers For Discharge : SNF placement  Consults  :   ID PC CM  Procedures  :  MRI right femur  DVT Prophylaxis  :  Lovenox -  Lab Results  Component Value Date   PLT 162 09/24/2015    Antibiotics  :   Anti-infectives    Start     Dose/Rate Route Frequency Ordered Stop   09/26/15 0000  ceFAZolin (ANCEF) 2-4 GM/100ML-% IVPB     2 g 200 mL/hr over 30 Minutes Intravenous Every 8 hours 09/26/15 1158 10/08/15 2359   09/25/15 1000  ceFAZolin (ANCEF) IVPB 2g/100 mL premix     2 g 200 mL/hr over 30 Minutes Intravenous Every 8 hours 09/25/15 0741     09/24/15 1800  ceFAZolin (ANCEF) IVPB 1 g/50 mL premix  Status:  Discontinued     1 g 100 mL/hr over 30 Minutes Intravenous Every 8 hours 09/24/15 1717 09/25/15 0741   09/20/15 1000  vancomycin (VANCOCIN) 1,250 mg in sodium chloride 0.9 % 250 mL IVPB  Status:  Discontinued     1,250 mg 166.7 mL/hr over 90 Minutes Intravenous Every 12 hours 09/20/15 0929 09/24/15 1717   09/19/15 2359  aztreonam (AZACTAM) 1 g in dextrose 5 % 50 mL IVPB  Status:  Discontinued     1 g 100 mL/hr over 30 Minutes Intravenous Every 8 hours 09/19/15 1445 09/19/15 2233   09/19/15 2200  linezolid (ZYVOX) IVPB 600 mg  Status:  Discontinued     600 mg 300 mL/hr over 60 Minutes Intravenous Every 12 hours 09/19/15 1419 09/20/15 0841   09/19/15 1800  vancomycin (VANCOCIN) IVPB 1000 mg/200 mL premix  Status:  Discontinued     1,000 mg 200 mL/hr over 60 Minutes Intravenous Every 12 hours 09/19/15 1046 09/19/15 1347   09/19/15 1800  piperacillin-tazobactam (ZOSYN) IVPB 3.375 g  Status:  Discontinued     3.375 g 12.5 mL/hr over 240 Minutes  Intravenous Every 8 hours 09/19/15 1046 09/19/15 1347   09/19/15 1800  levofloxacin (LEVAQUIN) IVPB 750 mg  Status:  Discontinued     750 mg 100 mL/hr over 90 Minutes Intravenous Every 24 hours 09/19/15 1446 09/19/15 2233   09/19/15 1600  aztreonam (AZACTAM) 2 g in dextrose 5 % 50 mL IVPB     2 g 100 mL/hr over 30 Minutes Intravenous  Once 09/19/15 1445 09/19/15 1815   09/19/15 0900  piperacillin-tazobactam (ZOSYN) IVPB 3.375 g     3.375 g 100 mL/hr over 30 Minutes Intravenous  Once 09/19/15 0855 09/19/15 1014   09/19/15 0900  vancomycin (VANCOCIN) IVPB 1000 mg/200 mL premix     1,000 mg 200 mL/hr over 60 Minutes Intravenous  Once 09/19/15 0855 09/19/15 1112        Objective:   Filed Vitals:   09/25/15 0619 09/25/15 1522 09/25/15 2200 09/26/15 0608  BP: 178/92 143/106 136/78 115/77  Pulse: 79 72 100 88  Temp: 98.4 F (36.9 C) 98.1 F (36.7 C) 98.3 F (36.8 C) 98.1 F (36.7 C)  TempSrc: Oral Oral Oral Oral  Resp: 16 15 16 16   Height:      Weight:      SpO2: 98% 99% 99% 96%    Wt Readings from Last 3 Encounters:  09/19/15 92.2 kg (203 lb 4.2 oz)  07/08/14 90.719 kg (200 lb)     Intake/Output Summary (Last 24 hours) at 09/26/15 1437 Last data filed at 09/26/15 1200  Gross per 24 hour  Intake    480 ml  Output   2000 ml  Net  -1520 ml     Physical Exam  Gen: not in distress HEENT: moist mucosa, supple neck Chest: clear b/l, no added  sounds CVS: N S1&S2, no murmurs, rubs or gallop GI: soft, NT, ND, BS+ Musculoskeletal: Warm, pitting edema bilaterally     Data Review:    CBC  Recent Labs Lab 09/20/15 0625 09/22/15 0416 09/24/15 0325  WBC 11.3* 8.2 10.5  HGB 9.2* 8.8* 9.5*  HCT 26.5* 25.6* 28.5*  PLT 135* 133* 162  MCV 89.5 87.4 89.3  MCH 31.1 30.0 29.8  MCHC 34.7 34.4 33.3  RDW 15.9* 15.3 15.1    Chemistries   Recent Labs Lab 09/20/15 0625 09/21/15 2101 09/22/15 0416 09/24/15 0325 09/26/15 0537  NA 130*  --  130* 131*  --   K 4.6  --   4.2 4.5  --   CL 101  --  101 100*  --   CO2 21*  --  23 23  --   GLUCOSE 128*  --  191* 201*  --   BUN 32*  --  28* 26*  --   CREATININE 1.07 0.82 0.73 0.81 0.64  CALCIUM 8.4*  --  8.9 8.8*  --   AST  --   --   --  43*  --   ALT  --   --   --  83*  --   ALKPHOS  --   --   --  62  --   BILITOT  --   --   --  0.8  --    ------------------------------------------------------------------------------------------------------------------ No results for input(s): CHOL, HDL, LDLCALC, TRIG, CHOLHDL, LDLDIRECT in the last 72 hours.  No results found for: HGBA1C ------------------------------------------------------------------------------------------------------------------ No results for input(s): TSH, T4TOTAL, T3FREE, THYROIDAB in the last 72 hours.  Invalid input(s): FREET3 ------------------------------------------------------------------------------------------------------------------ No results for input(s): VITAMINB12, FOLATE, FERRITIN, TIBC, IRON, RETICCTPCT in the last 72 hours.  Coagulation profile No results for input(s): INR, PROTIME in the last 168 hours.  No results for input(s): DDIMER in the last 72 hours.  Cardiac Enzymes No results for input(s): CKMB, TROPONINI, MYOGLOBIN in the last 168 hours.  Invalid input(s): CK ------------------------------------------------------------------------------------------------------------------ No results found for: BNP  Inpatient Medications  Scheduled Meds: .  ceFAZolin (ANCEF) IV  2 g Intravenous Q8H  . famotidine  10 mg Oral BID  . fentaNYL  50 mcg Transdermal Q72H  . furosemide  40 mg Oral Daily  . heparin subcutaneous  5,000 Units Subcutaneous Q8H  . lisinopril  10 mg Oral Daily  . polyethylene glycol  17 g Oral Daily  . sodium chloride flush  10-40 mL Intracatheter Q12H   Continuous Infusions:  PRN Meds:.acetaminophen **OR** acetaminophen, fentaNYL (SUBLIMAZE) injection, HYDROcodone-acetaminophen, ondansetron **OR**  ondansetron (ZOFRAN) IV, sodium chloride flush  Micro Results Recent Results (from the past 240 hour(s))  Blood culture (routine x 2)     Status: Abnormal   Collection Time: 09/19/15  8:40 AM  Result Value Ref Range Status   Specimen Description BLOOD LEFT ANTECUBITAL  Final   Special Requests BOTTLES DRAWN AEROBIC AND ANAEROBIC 5ML  Final   Culture  Setup Time   Final    GRAM POSITIVE COCCI IN CLUSTERS IN BOTH AEROBIC AND ANAEROBIC BOTTLES CRITICAL RESULT CALLED TO, READ BACK BY AND VERIFIED WITH: Lolita Rieger POWELL AT 2159 [id number] [place] Performed at [facility] (A)  Final   Report Status 09/22/2015 FINAL  Final   Organism ID, Bacteria STAPHYLOCOCCUS AUREUS  Final      Susceptibility   Staphylococcus aureus - MIC*    CIPROFLOXACIN <=0.5 SENSITIVE Sensitive  ERYTHROMYCIN 4 RESISTANT Resistant     GENTAMICIN <=0.5 SENSITIVE Sensitive     OXACILLIN 0.5 SENSITIVE Sensitive     TETRACYCLINE <=1 SENSITIVE Sensitive     VANCOMYCIN <=0.5 SENSITIVE Sensitive     TRIMETH/SULFA <=10 SENSITIVE Sensitive     CLINDAMYCIN <=0.25 RESISTANT Resistant     RIFAMPIN <=0.5 SENSITIVE Sensitive     Inducible Clindamycin POSITIVE Resistant     * STAPHYLOCOCCUS AUREUS  Blood Culture ID Panel (Reflexed)     Status: Abnormal   Collection Time: 09/19/15  8:40 AM  Result Value Ref Range Status   Enterococcus species NOT DETECTED NOT DETECTED Final   Vancomycin resistance NOT DETECTED NOT DETECTED Final   Listeria monocytogenes NOT DETECTED NOT DETECTED Final   Staphylococcus species DETECTED (A) NOT DETECTED Final    Comment: CRITICAL RESULT CALLED TO, READ BACK BY AND VERIFIED WITH: PHARM L POWELL AT 2159 [id number] MARTINB    Staphylococcus aureus DETECTED (A) NOT DETECTED Final    Comment: CRITICAL RESULT CALLED TO, READ BACK BY AND VERIFIED WITH: PHARM L POWELL AT 2159 [id number] MARTINB    Methicillin resistance NOT DETECTED NOT DETECTED Final    Streptococcus species NOT DETECTED NOT DETECTED Final   Streptococcus agalactiae NOT DETECTED NOT DETECTED Final   Streptococcus pneumoniae NOT DETECTED NOT DETECTED Final   Streptococcus pyogenes NOT DETECTED NOT DETECTED Final   Acinetobacter baumannii NOT DETECTED NOT DETECTED Final   Enterobacteriaceae species NOT DETECTED NOT DETECTED Final   Enterobacter cloacae complex NOT DETECTED NOT DETECTED Final   Escherichia coli NOT DETECTED NOT DETECTED Final   Klebsiella oxytoca NOT DETECTED NOT DETECTED Final   Klebsiella pneumoniae NOT DETECTED NOT DETECTED Final   Proteus species NOT DETECTED NOT DETECTED Final   Serratia marcescens NOT DETECTED NOT DETECTED Final   Carbapenem resistance NOT DETECTED NOT DETECTED Final   Haemophilus influenzae NOT DETECTED NOT DETECTED Final   Neisseria meningitidis NOT DETECTED NOT DETECTED Final   Pseudomonas aeruginosa NOT DETECTED NOT DETECTED Final   Candida albicans NOT DETECTED NOT DETECTED Final   Candida glabrata NOT DETECTED NOT DETECTED Final   Candida krusei NOT DETECTED NOT DETECTED Final   Candida parapsilosis NOT DETECTED NOT DETECTED Final   Candida tropicalis NOT DETECTED NOT DETECTED Final  Blood culture (routine x 2)     Status: Abnormal   Collection Time: 09/19/15  9:10 AM  Result Value Ref Range Status   Specimen Description BLOOD LEFT HAND  Final   Special Requests IN PEDIATRIC BOTTLE 2ML  Final   Culture  Setup Time   Final    GRAM POSITIVE COCCI IN CLUSTERS AEROBIC BOTTLE ONLY CRITICAL RESULT CALLED TO, READ BACK BY AND VERIFIED WITH: Lavell Luster [facility] 2221 09/19/15 A BROWNING    Culture (A)  Final    STAPHYLOCOCCUS AUREUS SUSCEPTIBILITIES PERFORMED ON PREVIOUS CULTURE WITHIN THE LAST 5 DAYS. Performed at [facility]    Report Status 09/22/2015 FINAL  Final  Urine culture     Status: None   Collection Time: 09/19/15 11:56 AM  Result Value Ref Range Status   Specimen Description URINE, CLEAN CATCH  Final    Special Requests NONE  Final   Culture NO GROWTH Performed at [facility]   Final   Report Status 09/20/2015 FINAL  Final  Culture, blood (Routine X 2) w Reflex to ID Panel     Status: Abnormal   Collection Time: 09/20/15  9:15 AM  Result Value Ref Range Status  Specimen Description BLOOD LEFT HAND  Final   Special Requests IN PEDIATRIC BOTTLE 4CC  Final   Culture  Setup Time   Final    GRAM POSITIVE COCCI IN CLUSTERS AEROBIC BOTTLE ONLY CRITICAL RESULT CALLED TO, READ BACK BY AND VERIFIED WITH: A RUNYON 09/21/15 @ 0751 M VESTAL    Culture (A)  Final    STAPHYLOCOCCUS AUREUS SUSCEPTIBILITIES PERFORMED ON PREVIOUS CULTURE WITHIN THE LAST 5 DAYS. Performed at [facility]    Report Status 09/22/2015 FINAL  Final  Culture, blood (Routine X 2) w Reflex to ID Panel     Status: Abnormal   Collection Time: 09/20/15  9:15 AM  Result Value Ref Range Status   Specimen Description BLOOD LEFT HAND  Final   Special Requests IN PEDIATRIC BOTTLE 3CC  Final   Culture  Setup Time   Final    GRAM POSITIVE COCCI IN CLUSTERS AEROBIC BOTTLE ONLY CRITICAL RESULT CALLED TO, READ BACK BY AND VERIFIED WITH: A RUNYON 09/21/15 @ 0751 M VESTAL    Culture (A)  Final    STAPHYLOCOCCUS AUREUS SUSCEPTIBILITIES PERFORMED ON PREVIOUS CULTURE WITHIN THE LAST 5 DAYS. Performed at [facility]    Report Status 09/22/2015 FINAL  Final  Anaerobic culture     Status: None   Collection Time: 09/21/15  7:30 AM  Result Value Ref Range Status   Specimen Description PORTA CATH  Final   Special Requests NONE  Final   Culture   Final    SPECIMEN/CONTAINER TYPE INAPPROPRIATE FOR ORDERED TEST, UNABLE TO Arlis Porta, RN AT 1103 09/23/15 BY L BENFIELD Performed at [facility]    Report Status 09/23/2015 FINAL  Final  Surgical pcr screen     Status: Abnormal   Collection Time: 09/21/15  7:36 AM  Result Value Ref Range Status   MRSA, PCR NEGATIVE NEGATIVE Final   Staphylococcus aureus  POSITIVE (A) NEGATIVE Final    Comment:        The Xpert SA Assay (FDA approved for NASAL specimens in patients over 62 years of age), is one component of a comprehensive surveillance program.  Test performance has been validated by [facility] for patients greater than or equal to 5 year old. It is not intended to diagnose infection nor to guide or monitor treatment.   Culture, blood (routine x 2)     Status: None (Preliminary result)   Collection Time: 09/21/15  2:36 PM  Result Value Ref Range Status   Specimen Description BLOOD RIGHT ANTECUBITAL  Final   Special Requests BOTTLES DRAWN AEROBIC AND ANAEROBIC 10CC  Final   Culture   Final    NO GROWTH 4 DAYS Performed at [facility]    Report Status PENDING  Incomplete  Culture, blood (routine x 2)     Status: None (Preliminary result)   Collection Time: 09/21/15  2:36 PM  Result Value Ref Range Status   Specimen Description BLOOD RIGHT HAND  Final   Special Requests BOTTLES DRAWN AEROBIC ONLY [place]  Final   Culture   Final    NO GROWTH 4 DAYS Performed at [facility]    Report Status PENDING  Incomplete    Radiology Reports Mr Femur Right W Wo Contrast  09/20/2015  CLINICAL DATA:  Right femur pain for 18 months. History of lymphoma. EXAM: MRI OF THE RIGHT FEMUR WITHOUT AND WITH CONTRAST TECHNIQUE: Multiplanar, multisequence MR imaging of the lower right extremity was performed both before and  after administration of intravenous contrast. CONTRAST:  67mL MULTIHANCE GADOBENATE DIMEGLUMINE 529 MG/ML IV SOLN COMPARISON:  None. FINDINGS: There is an enhancing 3.6 x 5.1 x 2.9 cm soft tissue mass in the right buttock deep to and possibly involving the right gluteus maximus muscle immediately adjacent to the right sciatic nerve. The patient has extensive patchy edema throughout muscles of both thighs, more prominent on the right than the left. There is asymmetric increased edema of the right adductor muscles of the right  side of the pelvis. The femur appears normal. No discrete hip effusion. Moderate bilateral knee effusions. There is circumferential subcutaneous edema and both thighs and in the scrotum. IMPRESSION: 1. Soft tissue mass immediately adjacent to the right sciatic nerve posterior to the right acetabulum and deep to the right gluteus maximus muscle consistent with lymphoma. 2. Asymmetric patchy extensive edema in the muscles of both thighs, right greater than left. This is consistent with nonspecific myositis. Electronically Signed   By: Lorriane Shire M.D.   On: 09/20/2015 15:04   Dg Chest Port 1 View  09/26/2015  CLINICAL DATA:  PICC line placement EXAM: PORTABLE CHEST 1 VIEW COMPARISON:  09/19/2015 FINDINGS: Cardiomediastinal silhouette is stable. No acute infiltrate or pulmonary edema. There is right arm PICC line with tip in distal SVC. No pneumothorax. IMPRESSION: Right PICC line in place.  No pneumothorax. Electronically Signed   By: Lahoma Crocker M.D.   On: 09/26/2015 12:20   Dg Chest Port 1 View  09/19/2015  CLINICAL DATA:  Non-Hodgkin's lymphoma. EXAM: PORTABLE CHEST 1 VIEW COMPARISON:  None. FINDINGS: There is pleural thickening along the lateral upper right hemithorax, measuring 6.1 x 2.3 cm. There is no edema or consolidation. The heart is borderline enlarged with pulmonary vascularity within normal limits. Atherosclerotic calcification is noted in the aorta. No adenopathy is evident. There are no blastic or lytic bone lesions evident. Port-A-Cath tip is in the superior vena cava. No evident pneumothorax. There is calcification in the left carotid artery. IMPRESSION: Pleural thickening on the right laterally. Chronicity of this finding uncertain. Given history of lymphoma and this pleural thickening, correlation with contrast enhanced chest CT may be of value to further assess. No edema or consolidation. Heart borderline enlarged. There is aortic atherosclerosis. There is also left carotid artery  calcification. No adenopathy is demonstrable by radiography. Electronically Signed   By: Lowella Grip III M.D.   On: 09/19/2015 09:30    Time Spent in minutes  25   Louellen Molder M.D on 09/26/2015 at 2:37 PM  Between 7am to 7pm - Pager - [phone]  After 7pm go to [email or web address] - password [facility]  [facility] -  Office  [phone]

## 2015-09-26 NOTE — Progress Notes (Signed)
Peripherally Inserted Central Catheter/Midline Placement  The IV Nurse has discussed with the patient and/or persons authorized to consent for the patient, the purpose of this procedure and the potential benefits and risks involved with this procedure.  The benefits include less needle sticks, lab draws from the catheter and patient may be discharged home with the catheter.  Risks include, but not limited to, infection, bleeding, blood clot (thrombus formation), and puncture of an artery; nerve damage and irregular heat beat.  Alternatives to this procedure were also discussed.  PICC/Midline Placement Documentation        Martin Norton 09/26/2015, 12:03 PM

## 2015-09-27 DIAGNOSIS — I5020 Unspecified systolic (congestive) heart failure: Secondary | ICD-10-CM

## 2015-09-27 DIAGNOSIS — A419 Sepsis, unspecified organism: Secondary | ICD-10-CM

## 2015-09-27 DIAGNOSIS — G893 Neoplasm related pain (acute) (chronic): Secondary | ICD-10-CM

## 2015-09-27 DIAGNOSIS — T80219D Unspecified infection due to central venous catheter, subsequent encounter: Secondary | ICD-10-CM

## 2015-09-27 DIAGNOSIS — F05 Delirium due to known physiological condition: Secondary | ICD-10-CM

## 2015-09-27 NOTE — Discharge Summary (Addendum)
Physician Discharge Summary  Martin Norton [id number] DOB: December 16, 1955 DOA: 09/19/2015  PCP: Red Christians, MD  Admit date: 09/19/2015 Discharge date: 09/27/2015  Admitted From: Home Disposition:  [facility]  Recommendations for Outpatient Follow-up:  1. Follow with M.D. at skilled nursing facility in one week. Completes 2 weeks course of IV antibiotics on 10/08/2015. Please remove PICC line after completion of antibiotics. 2. Patient should follow-up with his oncologist at [facility] in 2 weeks.  Home Health:none  Equipment/Devices:None  Discharge Condition: Stable CODE STATUS: DO NOT RESUSCITATE Diet recommendation: Regular    Discharge Diagnoses:  Principal Problem:   Septic shock ([place])   Active Problems:   Right thigh pain   Diffuse large B-cell lymphoma (HCC)   Arterial hypotension   Lactic acid acidosis   Hypotension   Staphylococcus aureus bacteremia with sepsis (HCC)   Leg pain, right   Pleural effusion   Infected venous access port   Palliative care encounter   Goals of care, counseling/discussion   Acute confusional state   Neoplasm related pain   Essential hypertension, benign   Lymphedema   Systolic CHF, chronicity not defined  Brief narrative/history of present illness Please refer to admission H&P for details, in brief,60 year old male with history of hypertension, diabetes mellitus, refractory diffuse large B-cell lymphoma treated at [facility] with chronic lymphedema presented with worsening leg pain. Ace and was found to have lactic acidosis, subsequently became hypotensive in the ED. He was found to be septic and admitted to stepdown unit with aggressive IV hydration and empiric antibiotics. Blood cultures grew MSSA.   Hospital course  Principal problem Staphylococcus aureus bacteremia with sepsis ([place]) Sepsis resolved. Off fluids. TEE negative for vegetation. Port-A-Cath removed which is likely the source  of infection.. Repeat blood cultures without further growth. Antibiotic switched to Ancef with plan on 2 weeks course. PICC line placed. Stop date for antibiotics: 7/25.  Active Problems: Right leg pain Suspected due to lymphoma deep to the right gluteus. Placed On fentanyl patch and when necessary oxycodone  with good response. Patient was also on tramadol and low-dose Decadron as outpatient for pain which will be continued.   Diffuse large B-cell lymphoma ([place]) Follows with oncologist at [facility]. He should follow-up with his oncologist in 2 weeks after discharge.  Chronic lymphedema. Likely contributed by his lymphoma. Added daily Lasix.  Essential hypertension Hypotension with sepsis resolved. Resumed his losartan. Added daily Lasix.  Mild systolic CHF No signs or symptoms. Echo showing decreased LV function of 40-45%. Continue ARB. If blood pressure remains stable as outpatient please add beta blocker. I have added daily Lasix that can help his lymphedema also.    Type 2 diabetes mellitus Stable. Resume metformin upon discharge.  Hyponatremia Possibly due to dehydration and sepsis. Follow labs as outpatient.   Patient seen by physical therapy and recommended skilled nursing facility.   Family Communication : None at bedside today.  Consults :  ID PC CM Palliative care  Procedures :  MRI right femur Right upper extremity PICC line (7/13)   Discharge Instructions     Medication List    TAKE these medications        acetaminophen 500 MG tablet  Commonly known as:  TYLENOL  Take 1,000 mg by mouth every 6 (six) hours as needed for mild pain.     ceFAZolin 2-4 GM/100ML-% IVPB  Commonly known as:  ANCEF  Inject 100 mLs (2 g total) into the vein every 8 (  eight) hours.     dexamethasone 1 MG tablet  Commonly known as:  DECADRON  Take 2 mg by mouth 2 (two) times daily with a meal.     fentaNYL 25 MCG/HR patch  Commonly known as:  DURAGESIC  Place 1  patch (25 mcg total) onto the skin every 3 (three) days.     furosemide 40 MG tablet  Commonly known as:  LASIX  Take 1 tablet (40 mg total) by mouth daily.     losartan 50 MG tablet  Commonly known as:  COZAAR  Take 50 mg by mouth daily after supper.     metFORMIN 500 MG tablet  Commonly known as:  GLUCOPHAGE  Take 500 mg by mouth 2 (two) times daily with a meal.     oxyCODONE 5 MG immediate release tablet  Commonly known as:  Oxy IR/ROXICODONE  Take 1 tablet (5 mg total) by mouth every 4 (four) hours as needed for moderate pain.     traMADol 50 MG tablet  Commonly known as:  ULTRAM  Take 50 mg by mouth every 6 (six) hours as needed for moderate pain.     Vitamin D3 5000 units Caps  Take 5,000 Units by mouth daily.           Follow-up Information    Please follow up.   Why:  MD at SNF in 1 week      Please follow up.   Why:  oncologist at [place] in 2 weeks     Allergies  Allergen Reactions  . Zosyn [Piperacillin Sod-Tazobactam So]     Possible cause of lip swelling.  Tolerating Ancef as of 09/25/15.       Procedures/Studies: Mr Femur Right W Wo Contrast  09/20/2015  CLINICAL DATA:  Right femur pain for 18 months. History of lymphoma. EXAM: MRI OF THE RIGHT FEMUR WITHOUT AND WITH CONTRAST TECHNIQUE: Multiplanar, multisequence MR imaging of the lower right extremity was performed both before and after administration of intravenous contrast. CONTRAST:  54mL MULTIHANCE GADOBENATE DIMEGLUMINE 529 MG/ML IV SOLN COMPARISON:  None. FINDINGS: There is an enhancing 3.6 x 5.1 x 2.9 cm soft tissue mass in the right buttock deep to and possibly involving the right gluteus maximus muscle immediately adjacent to the right sciatic nerve. The patient has extensive patchy edema throughout muscles of both thighs, more prominent on the right than the left. There is asymmetric increased edema of the right adductor muscles of the right side of the pelvis. The femur appears normal. No  discrete hip effusion. Moderate bilateral knee effusions. There is circumferential subcutaneous edema and both thighs and in the scrotum. IMPRESSION: 1. Soft tissue mass immediately adjacent to the right sciatic nerve posterior to the right acetabulum and deep to the right gluteus maximus muscle consistent with lymphoma. 2. Asymmetric patchy extensive edema in the muscles of both thighs, right greater than left. This is consistent with nonspecific myositis. Electronically Signed   By: Lorriane Shire M.D.   On: 09/20/2015 15:04   Dg Chest Port 1 View  09/26/2015  CLINICAL DATA:  PICC line placement EXAM: PORTABLE CHEST 1 VIEW COMPARISON:  09/19/2015 FINDINGS: Cardiomediastinal silhouette is stable. No acute infiltrate or pulmonary edema. There is right arm PICC line with tip in distal SVC. No pneumothorax. IMPRESSION: Right PICC line in place.  No pneumothorax. Electronically Signed   By: Lahoma Crocker M.D.   On: 09/26/2015 12:20   Dg Chest Port 1 View  09/19/2015  CLINICAL DATA:  Non-Hodgkin's lymphoma. EXAM: PORTABLE CHEST 1 VIEW COMPARISON:  None. FINDINGS: There is pleural thickening along the lateral upper right hemithorax, measuring 6.1 x 2.3 cm. There is no edema or consolidation. The heart is borderline enlarged with pulmonary vascularity within normal limits. Atherosclerotic calcification is noted in the aorta. No adenopathy is evident. There are no blastic or lytic bone lesions evident. Port-A-Cath tip is in the superior vena cava. No evident pneumothorax. There is calcification in the left carotid artery. IMPRESSION: Pleural thickening on the right laterally. Chronicity of this finding uncertain. Given history of lymphoma and this pleural thickening, correlation with contrast enhanced chest CT may be of value to further assess. No edema or consolidation. Heart borderline enlarged. There is aortic atherosclerosis. There is also left carotid artery calcification. No adenopathy is demonstrable by  radiography. Electronically Signed   By: Lowella Grip III M.D.   On: 09/19/2015 09:30    TEE 7/11 Mild LV dysfunction with EF of 40-45% negative for vegetation. Mild MR, aortic valve sclerosis.   Subjective: Denies any specific symptoms. No overnight issues.  Discharge Exam: Filed Vitals:   09/26/15 2132 09/27/15 0706  BP: 97/69 117/69  Pulse: 113 87  Temp: 98.6 F (37 C) 98.4 F (36.9 C)  Resp: 18 19   Filed Vitals:   09/26/15 0608 09/26/15 1500 09/26/15 2132 09/27/15 0706  BP: 115/77 105/70 97/69 117/69  Pulse: 88 92 113 87  Temp: 98.1 F (36.7 C) 98.7 F (37.1 C) 98.6 F (37 C) 98.4 F (36.9 C)  TempSrc: Oral Oral Oral Oral  Resp: 16 18 18 19   Height:      Weight:      SpO2: 96% 98% 99% 84%     Gen: not in distress HEENT: moist mucosa, supple neck Chest: clear b/l, no added sounds CVS: N S1&S2, no murmurs, rubs or gallop GI: soft, NT, ND, BS+ Musculoskeletal: Warm, pitting edema bilaterally   The results of significant diagnostics from this hospitalization (including imaging, microbiology, ancillary and laboratory) are listed below for reference.     Microbiology: Recent Results (from the past 240 hour(s))  Blood culture (routine x 2)     Status: Abnormal   Collection Time: 09/19/15  8:40 AM  Result Value Ref Range Status   Specimen Description BLOOD LEFT ANTECUBITAL  Final   Special Requests BOTTLES DRAWN AEROBIC AND ANAEROBIC 5ML  Final   Culture  Setup Time   Final    GRAM POSITIVE COCCI IN CLUSTERS IN BOTH AEROBIC AND ANAEROBIC BOTTLES CRITICAL RESULT CALLED TO, READ BACK BY AND VERIFIED WITH: PHARM L POWELL AT 2159 [id number] [place] Performed at [place] (A)  Final   Report Status 09/22/2015 FINAL  Final   Organism ID, Bacteria STAPHYLOCOCCUS AUREUS  Final      Susceptibility   Staphylococcus aureus - MIC*    CIPROFLOXACIN <=0.5 SENSITIVE Sensitive     ERYTHROMYCIN 4 RESISTANT Resistant      GENTAMICIN <=0.5 SENSITIVE Sensitive     OXACILLIN 0.5 SENSITIVE Sensitive     TETRACYCLINE <=1 SENSITIVE Sensitive     VANCOMYCIN <=0.5 SENSITIVE Sensitive     TRIMETH/SULFA <=10 SENSITIVE Sensitive     CLINDAMYCIN <=0.25 RESISTANT Resistant     RIFAMPIN <=0.5 SENSITIVE Sensitive     Inducible Clindamycin POSITIVE Resistant     * STAPHYLOCOCCUS AUREUS  Blood Culture ID Panel (Reflexed)     Status: Abnormal   Collection Time: 09/19/15  8:40 AM  Result Value Ref Range Status   Enterococcus species NOT DETECTED NOT DETECTED Final   Vancomycin resistance NOT DETECTED NOT DETECTED Final   Listeria monocytogenes NOT DETECTED NOT DETECTED Final   Staphylococcus species DETECTED (A) NOT DETECTED Final    Comment: CRITICAL RESULT CALLED TO, READ BACK BY AND VERIFIED WITH: PHARM L POWELL AT 2159 [id number] MARTINB    Staphylococcus aureus DETECTED (A) NOT DETECTED Final    Comment: CRITICAL RESULT CALLED TO, READ BACK BY AND VERIFIED WITH: PHARM L POWELL AT 2159 [id number] MARTINB    Methicillin resistance NOT DETECTED NOT DETECTED Final   Streptococcus species NOT DETECTED NOT DETECTED Final   Streptococcus agalactiae NOT DETECTED NOT DETECTED Final   Streptococcus pneumoniae NOT DETECTED NOT DETECTED Final   Streptococcus pyogenes NOT DETECTED NOT DETECTED Final   Acinetobacter baumannii NOT DETECTED NOT DETECTED Final   Enterobacteriaceae species NOT DETECTED NOT DETECTED Final   Enterobacter cloacae complex NOT DETECTED NOT DETECTED Final   Escherichia coli NOT DETECTED NOT DETECTED Final   Klebsiella oxytoca NOT DETECTED NOT DETECTED Final   Klebsiella pneumoniae NOT DETECTED NOT DETECTED Final   Proteus species NOT DETECTED NOT DETECTED Final   Serratia marcescens NOT DETECTED NOT DETECTED Final   Carbapenem resistance NOT DETECTED NOT DETECTED Final   Haemophilus influenzae NOT DETECTED NOT DETECTED Final   Neisseria meningitidis NOT DETECTED NOT DETECTED Final   Pseudomonas  aeruginosa NOT DETECTED NOT DETECTED Final   Candida albicans NOT DETECTED NOT DETECTED Final   Candida glabrata NOT DETECTED NOT DETECTED Final   Candida krusei NOT DETECTED NOT DETECTED Final   Candida parapsilosis NOT DETECTED NOT DETECTED Final   Candida tropicalis NOT DETECTED NOT DETECTED Final  Blood culture (routine x 2)     Status: Abnormal   Collection Time: 09/19/15  9:10 AM  Result Value Ref Range Status   Specimen Description BLOOD LEFT HAND  Final   Special Requests IN PEDIATRIC BOTTLE 2ML  Final   Culture  Setup Time   Final    GRAM POSITIVE COCCI IN CLUSTERS AEROBIC BOTTLE ONLY CRITICAL RESULT CALLED TO, READ BACK BY AND VERIFIED WITH: Lavell Luster [facility] 2221 09/19/15 A BROWNING    Culture (A)  Final    STAPHYLOCOCCUS AUREUS SUSCEPTIBILITIES PERFORMED ON PREVIOUS CULTURE WITHIN THE LAST 5 DAYS. Performed at [facility]    Report Status 09/22/2015 FINAL  Final  Urine culture     Status: None   Collection Time: 09/19/15 11:56 AM  Result Value Ref Range Status   Specimen Description URINE, CLEAN CATCH  Final   Special Requests NONE  Final   Culture NO GROWTH Performed at [facility]   Final   Report Status 09/20/2015 FINAL  Final  Culture, blood (Routine X 2) w Reflex to ID Panel     Status: Abnormal   Collection Time: 09/20/15  9:15 AM  Result Value Ref Range Status   Specimen Description BLOOD LEFT HAND  Final   Special Requests IN PEDIATRIC BOTTLE 4CC  Final   Culture  Setup Time   Final    GRAM POSITIVE COCCI IN CLUSTERS AEROBIC BOTTLE ONLY CRITICAL RESULT CALLED TO, READ BACK BY AND VERIFIED WITH: A RUNYON 09/21/15 @ 0751 M VESTAL    Culture (A)  Final    STAPHYLOCOCCUS AUREUS SUSCEPTIBILITIES PERFORMED ON PREVIOUS CULTURE WITHIN THE LAST 5 DAYS. Performed at [facility]    Report Status 09/22/2015 FINAL  Final  Culture, blood (Routine X 2)  w Reflex to ID Panel     Status: Abnormal   Collection Time: 09/20/15  9:15 AM  Result Value  Ref Range Status   Specimen Description BLOOD LEFT HAND  Final   Special Requests IN PEDIATRIC BOTTLE 3CC  Final   Culture  Setup Time   Final    GRAM POSITIVE COCCI IN CLUSTERS AEROBIC BOTTLE ONLY CRITICAL RESULT CALLED TO, READ BACK BY AND VERIFIED WITH: A RUNYON 09/21/15 @ 0751 M VESTAL    Culture (A)  Final    STAPHYLOCOCCUS AUREUS SUSCEPTIBILITIES PERFORMED ON PREVIOUS CULTURE WITHIN THE LAST 5 DAYS. Performed at [facility]    Report Status 09/22/2015 FINAL  Final  Anaerobic culture     Status: None   Collection Time: 09/21/15  7:30 AM  Result Value Ref Range Status   Specimen Description PORTA CATH  Final   Special Requests NONE  Final   Culture   Final    SPECIMEN/CONTAINER TYPE INAPPROPRIATE FOR ORDERED TEST, UNABLE TO Arlis Porta, RN AT 1103 09/23/15 BY L BENFIELD Performed at [facility]    Report Status 09/23/2015 FINAL  Final  Surgical pcr screen     Status: Abnormal   Collection Time: 09/21/15  7:36 AM  Result Value Ref Range Status   MRSA, PCR NEGATIVE NEGATIVE Final   Staphylococcus aureus POSITIVE (A) NEGATIVE Final    Comment:        The Xpert SA Assay (FDA approved for NASAL specimens in patients over 30 years of age), is one component of a comprehensive surveillance program.  Test performance has been validated by [facility] for patients greater than or equal to 32 year old. It is not intended to diagnose infection nor to guide or monitor treatment.   Culture, blood (routine x 2)     Status: None   Collection Time: 09/21/15  2:36 PM  Result Value Ref Range Status   Specimen Description BLOOD RIGHT ANTECUBITAL  Final   Special Requests BOTTLES DRAWN AEROBIC AND ANAEROBIC 10CC  Final   Culture   Final    NO GROWTH 5 DAYS Performed at [facility]    Report Status 09/26/2015 FINAL  Final  Culture, blood (routine x 2)     Status: None   Collection Time: 09/21/15  2:36 PM  Result Value Ref Range Status   Specimen  Description BLOOD RIGHT HAND  Final   Special Requests BOTTLES DRAWN AEROBIC ONLY [place]  Final   Culture   Final    NO GROWTH 5 DAYS Performed at [facility]    Report Status 09/26/2015 FINAL  Final     Labs: BNP (last 3 results) No results for input(s): BNP in the last 8760 hours. Basic Metabolic Panel:  Recent Labs Lab 09/21/15 2101 09/22/15 0416 09/24/15 0325 09/26/15 0537  NA  --  130* 131*  --   K  --  4.2 4.5  --   CL  --  101 100*  --   CO2  --  23 23  --   GLUCOSE  --  191* 201*  --   BUN  --  28* 26*  --   CREATININE 0.82 0.73 0.81 0.64  CALCIUM  --  8.9 8.8*  --    Liver Function Tests:  Recent Labs Lab 09/24/15 0325  AST 43*  ALT 83*  ALKPHOS 62  BILITOT 0.8  PROT 6.1*  ALBUMIN 3.0*   No results for input(s): LIPASE, AMYLASE in  the last 168 hours. No results for input(s): AMMONIA in the last 168 hours. CBC:  Recent Labs Lab 09/22/15 0416 09/24/15 0325  WBC 8.2 10.5  HGB 8.8* 9.5*  HCT 25.6* 28.5*  MCV 87.4 89.3  PLT 133* 162   Cardiac Enzymes: No results for input(s): CKTOTAL, CKMB, CKMBINDEX, TROPONINI in the last 168 hours. BNP: Invalid input(s): POCBNP CBG:  Recent Labs Lab 09/21/15 0859  GLUCAP 160*   D-Dimer No results for input(s): DDIMER in the last 72 hours. Hgb A1c No results for input(s): HGBA1C in the last 72 hours. Lipid Profile No results for input(s): CHOL, HDL, LDLCALC, TRIG, CHOLHDL, LDLDIRECT in the last 72 hours. Thyroid function studies No results for input(s): TSH, T4TOTAL, T3FREE, THYROIDAB in the last 72 hours.  Invalid input(s): FREET3 Anemia work up No results for input(s): VITAMINB12, FOLATE, FERRITIN, TIBC, IRON, RETICCTPCT in the last 72 hours. Urinalysis    Component Value Date/Time   COLORURINE YELLOW 09/19/2015 1156   APPEARANCEUR CLEAR 09/19/2015 1156   LABSPEC 1.020 09/19/2015 1156   PHURINE 6.0 09/19/2015 1156   GLUCOSEU NEGATIVE 09/19/2015 1156   HGBUR NEGATIVE 09/19/2015 1156    BILIRUBINUR NEGATIVE 09/19/2015 1156   KETONESUR NEGATIVE 09/19/2015 1156   PROTEINUR NEGATIVE 09/19/2015 1156   UROBILINOGEN 0.2 07/08/2014 1624   NITRITE NEGATIVE 09/19/2015 1156   LEUKOCYTESUR NEGATIVE 09/19/2015 1156   Sepsis Labs Invalid input(s): PROCALCITONIN,  WBC,  LACTICIDVEN Microbiology Recent Results (from the past 240 hour(s))  Blood culture (routine x 2)     Status: Abnormal   Collection Time: 09/19/15  8:40 AM  Result Value Ref Range Status   Specimen Description BLOOD LEFT ANTECUBITAL  Final   Special Requests BOTTLES DRAWN AEROBIC AND ANAEROBIC 5ML  Final   Culture  Setup Time   Final    GRAM POSITIVE COCCI IN CLUSTERS IN BOTH AEROBIC AND ANAEROBIC BOTTLES CRITICAL RESULT CALLED TO, READ BACK BY AND VERIFIED WITH: Lolita Rieger POWELL AT 2159 [id number] [place] Performed at [place] (A)  Final   Report Status 09/22/2015 FINAL  Final   Organism ID, Bacteria STAPHYLOCOCCUS AUREUS  Final      Susceptibility   Staphylococcus aureus - MIC*    CIPROFLOXACIN <=0.5 SENSITIVE Sensitive     ERYTHROMYCIN 4 RESISTANT Resistant     GENTAMICIN <=0.5 SENSITIVE Sensitive     OXACILLIN 0.5 SENSITIVE Sensitive     TETRACYCLINE <=1 SENSITIVE Sensitive     VANCOMYCIN <=0.5 SENSITIVE Sensitive     TRIMETH/SULFA <=10 SENSITIVE Sensitive     CLINDAMYCIN <=0.25 RESISTANT Resistant     RIFAMPIN <=0.5 SENSITIVE Sensitive     Inducible Clindamycin POSITIVE Resistant     * STAPHYLOCOCCUS AUREUS  Blood Culture ID Panel (Reflexed)     Status: Abnormal   Collection Time: 09/19/15  8:40 AM  Result Value Ref Range Status   Enterococcus species NOT DETECTED NOT DETECTED Final   Vancomycin resistance NOT DETECTED NOT DETECTED Final   Listeria monocytogenes NOT DETECTED NOT DETECTED Final   Staphylococcus species DETECTED (A) NOT DETECTED Final    Comment: CRITICAL RESULT CALLED TO, READ BACK BY AND VERIFIED WITH: PHARM L POWELL AT 2159 [id number]  MARTINB    Staphylococcus aureus DETECTED (A) NOT DETECTED Final    Comment: CRITICAL RESULT CALLED TO, READ BACK BY AND VERIFIED WITH: PHARM L POWELL AT 2159 [id number] MARTINB    Methicillin resistance NOT DETECTED NOT DETECTED Final   Streptococcus species NOT DETECTED  NOT DETECTED Final   Streptococcus agalactiae NOT DETECTED NOT DETECTED Final   Streptococcus pneumoniae NOT DETECTED NOT DETECTED Final   Streptococcus pyogenes NOT DETECTED NOT DETECTED Final   Acinetobacter baumannii NOT DETECTED NOT DETECTED Final   Enterobacteriaceae species NOT DETECTED NOT DETECTED Final   Enterobacter cloacae complex NOT DETECTED NOT DETECTED Final   Escherichia coli NOT DETECTED NOT DETECTED Final   Klebsiella oxytoca NOT DETECTED NOT DETECTED Final   Klebsiella pneumoniae NOT DETECTED NOT DETECTED Final   Proteus species NOT DETECTED NOT DETECTED Final   Serratia marcescens NOT DETECTED NOT DETECTED Final   Carbapenem resistance NOT DETECTED NOT DETECTED Final   Haemophilus influenzae NOT DETECTED NOT DETECTED Final   Neisseria meningitidis NOT DETECTED NOT DETECTED Final   Pseudomonas aeruginosa NOT DETECTED NOT DETECTED Final   Candida albicans NOT DETECTED NOT DETECTED Final   Candida glabrata NOT DETECTED NOT DETECTED Final   Candida krusei NOT DETECTED NOT DETECTED Final   Candida parapsilosis NOT DETECTED NOT DETECTED Final   Candida tropicalis NOT DETECTED NOT DETECTED Final  Blood culture (routine x 2)     Status: Abnormal   Collection Time: 09/19/15  9:10 AM  Result Value Ref Range Status   Specimen Description BLOOD LEFT HAND  Final   Special Requests IN PEDIATRIC BOTTLE 2ML  Final   Culture  Setup Time   Final    GRAM POSITIVE COCCI IN CLUSTERS AEROBIC BOTTLE ONLY CRITICAL RESULT CALLED TO, READ BACK BY AND VERIFIED WITH: Lavell Luster [facility] 2221 09/19/15 A BROWNING    Culture (A)  Final    STAPHYLOCOCCUS AUREUS SUSCEPTIBILITIES PERFORMED ON PREVIOUS CULTURE WITHIN THE LAST 5  DAYS. Performed at [facility]    Report Status 09/22/2015 FINAL  Final  Urine culture     Status: None   Collection Time: 09/19/15 11:56 AM  Result Value Ref Range Status   Specimen Description URINE, CLEAN CATCH  Final   Special Requests NONE  Final   Culture NO GROWTH Performed at [facility]   Final   Report Status 09/20/2015 FINAL  Final  Culture, blood (Routine X 2) w Reflex to ID Panel     Status: Abnormal   Collection Time: 09/20/15  9:15 AM  Result Value Ref Range Status   Specimen Description BLOOD LEFT HAND  Final   Special Requests IN PEDIATRIC BOTTLE 4CC  Final   Culture  Setup Time   Final    GRAM POSITIVE COCCI IN CLUSTERS AEROBIC BOTTLE ONLY CRITICAL RESULT CALLED TO, READ BACK BY AND VERIFIED WITH: A RUNYON 09/21/15 @ 0751 M VESTAL    Culture (A)  Final    STAPHYLOCOCCUS AUREUS SUSCEPTIBILITIES PERFORMED ON PREVIOUS CULTURE WITHIN THE LAST 5 DAYS. Performed at [facility]    Report Status 09/22/2015 FINAL  Final  Culture, blood (Routine X 2) w Reflex to ID Panel     Status: Abnormal   Collection Time: 09/20/15  9:15 AM  Result Value Ref Range Status   Specimen Description BLOOD LEFT HAND  Final   Special Requests IN PEDIATRIC BOTTLE 3CC  Final   Culture  Setup Time   Final    GRAM POSITIVE COCCI IN CLUSTERS AEROBIC BOTTLE ONLY CRITICAL RESULT CALLED TO, READ BACK BY AND VERIFIED WITH: A RUNYON 09/21/15 @ 0751 M VESTAL    Culture (A)  Final    STAPHYLOCOCCUS AUREUS SUSCEPTIBILITIES PERFORMED ON PREVIOUS CULTURE WITHIN THE LAST 5 DAYS. Performed at [facility]  Report Status 09/22/2015 FINAL  Final  Anaerobic culture     Status: None   Collection Time: 09/21/15  7:30 AM  Result Value Ref Range Status   Specimen Description PORTA CATH  Final   Special Requests NONE  Final   Culture   Final    SPECIMEN/CONTAINER TYPE INAPPROPRIATE FOR ORDERED TEST, UNABLE TO Arlis Porta, RN AT 1103 09/23/15 BY L BENFIELD Performed at  [facility]    Report Status 09/23/2015 FINAL  Final  Surgical pcr screen     Status: Abnormal   Collection Time: 09/21/15  7:36 AM  Result Value Ref Range Status   MRSA, PCR NEGATIVE NEGATIVE Final   Staphylococcus aureus POSITIVE (A) NEGATIVE Final    Comment:        The Xpert SA Assay (FDA approved for NASAL specimens in patients over 37 years of age), is one component of a comprehensive surveillance program.  Test performance has been validated by [facility] for patients greater than or equal to 4 year old. It is not intended to diagnose infection nor to guide or monitor treatment.   Culture, blood (routine x 2)     Status: None   Collection Time: 09/21/15  2:36 PM  Result Value Ref Range Status   Specimen Description BLOOD RIGHT ANTECUBITAL  Final   Special Requests BOTTLES DRAWN AEROBIC AND ANAEROBIC 10CC  Final   Culture   Final    NO GROWTH 5 DAYS Performed at [facility]    Report Status 09/26/2015 FINAL  Final  Culture, blood (routine x 2)     Status: None   Collection Time: 09/21/15  2:36 PM  Result Value Ref Range Status   Specimen Description BLOOD RIGHT HAND  Final   Special Requests BOTTLES DRAWN AEROBIC ONLY [place]  Final   Culture   Final    NO GROWTH 5 DAYS Performed at [facility]    Report Status 09/26/2015 FINAL  Final     Time coordinating discharge: Over 30 minutes  SIGNED:   Louellen Molder, MD  [facility] 09/27/2015, 10:48 AM Pager   If 7PM-7AM, please contact night-coverage [email or web address] Password [email or web address]

## 2015-09-27 NOTE — Progress Notes (Signed)
Patient is set to discharge to [facility] today. Patient & girlfriend, [name], aware. Discharge packet given to RN, Legrand Rams. [facility] called for transport.   Kingsley Spittle, [place] Clinical Social Worker [phone]

## 2015-09-27 NOTE — Clinical Social Work Placement (Signed)
   CLINICAL SOCIAL WORK PLACEMENT  NOTE  Date:  09/27/2015  Patient Details  Name: Martin Norton MRN: [id number] Date of Birth: November 01, 1955  Clinical Social Work is seeking post-discharge placement for this patient at the [place] level of care (*CSW will initial, date and re-position this form in  chart as items are completed):  Yes   Patient/family provided with [place] Work Department's list of facilities offering this level of care within the geographic area requested by the patient (or if unable, by the patient's family).  Yes   Patient/family informed of their freedom to choose among providers that offer the needed level of care, that participate in Medicare, Medicaid or managed care program needed by the patient, have an available bed and are willing to accept the patient.  Yes   Patient/family informed of [facility]'s ownership interest in [facility] and [facility], as well as of the fact that they are under no obligation to receive care at these facilities.  PASRR submitted to EDS on       PASRR number received on       Existing PASRR number confirmed on 09/24/15     FL2 transmitted to all facilities in geographic area requested by pt/family on 09/24/15     FL2 transmitted to all facilities within larger geographic area on       Patient informed that his/her managed care company has contracts with or will negotiate with certain facilities, including the following:        Yes   Patient/family informed of bed offers received.  Patient chooses bed at [facility]     Physician recommends and patient chooses bed at      Patient to be transferred to [facility] on 09/27/15.  Patient to be transferred to facility by [facility]     Patient family notified on 09/27/15 of transfer.  Name of family member notified:  Girlfriend- LIsa     PHYSICIAN       Additional Comment:     _______________________________________________ [name], LCSW 09/27/2015, 10:46 AM

## 2015-10-01 ENCOUNTER — Non-Acute Institutional Stay (SKILLED_NURSING_FACILITY): Payer: BLUE CROSS/BLUE SHIELD | Admitting: Internal Medicine

## 2015-10-01 ENCOUNTER — Encounter: Payer: Self-pay | Admitting: Internal Medicine

## 2015-10-01 DIAGNOSIS — E559 Vitamin D deficiency, unspecified: Secondary | ICD-10-CM

## 2015-10-01 DIAGNOSIS — T80219D Unspecified infection due to central venous catheter, subsequent encounter: Secondary | ICD-10-CM | POA: Diagnosis not present

## 2015-10-01 DIAGNOSIS — I890 Lymphedema, not elsewhere classified: Secondary | ICD-10-CM

## 2015-10-01 DIAGNOSIS — I10 Essential (primary) hypertension: Secondary | ICD-10-CM | POA: Diagnosis not present

## 2015-10-01 DIAGNOSIS — I5022 Chronic systolic (congestive) heart failure: Secondary | ICD-10-CM

## 2015-10-01 DIAGNOSIS — M79651 Pain in right thigh: Secondary | ICD-10-CM

## 2015-10-01 DIAGNOSIS — C8330 Diffuse large B-cell lymphoma, unspecified site: Secondary | ICD-10-CM | POA: Diagnosis not present

## 2015-10-01 DIAGNOSIS — A4101 Sepsis due to Methicillin susceptible Staphylococcus aureus: Secondary | ICD-10-CM

## 2015-10-01 DIAGNOSIS — E871 Hypo-osmolality and hyponatremia: Secondary | ICD-10-CM

## 2015-10-01 DIAGNOSIS — E119 Type 2 diabetes mellitus without complications: Secondary | ICD-10-CM

## 2015-10-01 NOTE — Progress Notes (Signed)
MRN: [id number] Name: Martin Norton  Sex: male Age: 60 y.o. DOB: 1955-05-28  [place] #:  Facility/Room: Day / 108 P Level Of Care: SNF Provider: Noah Delaine. Sheppard Coil, MD Emergency Contacts: Extended Emergency Contact Information Primary Emergency Contact: [place] of [place] Phone: [phone] Relation: Brother Secondary Emergency Contact: Ledora Bottcher [place] of [place] Phone: [phone] Relation: Significant other  Code Status: DNR  Allergies: Zosyn  Chief Complaint  Patient presents with  . New Admit To SNF    Admit to Facility    HPI: Patient is 60 y.o. male with hypertension, diabetes mellitus, refractory diffuse large B-cell lymphoma treated at [facility] with chronic lymphedema presented with worsening leg pain.Pt was admitted to [facility] from 7/6-14 where he  was found to have lactic acidosis, subsequently became hypotensive in the ED. He was found to be septic and admitted to stepdown unit with aggressive IV hydration and empiric antibiotics. Blood cultures grew MSSA from infected port. Pt is admitted to SNF for OT/PT. While at SNF pt will be followed for chronic lymphedema, tx with lasix, DM2, tx with glucophage and HTN, tx with losartan and lasix.  Past Medical History  Diagnosis Date  . Diabetes mellitus without complication ([place])   . Cancer [facility])     Past Surgical History  Procedure Laterality Date  . Lymph node biopsy    . Port-a-cath removal N/A 09/21/2015    Procedure: REMOVAL PORT-A-CATH;  Surgeon: Alphonsa Overall, MD;  Location: [facility] ORS;  Service: General;  Laterality: N/A;  . Tee without cardioversion N/A 09/24/2015    Procedure: TRANSESOPHAGEAL ECHOCARDIOGRAM (TEE);  Surgeon: Sueanne Margarita, MD;  Location: [facility] ENDOSCOPY;  Service: Cardiovascular;  Laterality: N/A;      Medication List       This list is accurate as of: 10/01/15 11:59 PM.  Always use your most recent med list.                acetaminophen 500 MG tablet  Commonly known as:  TYLENOL  Take 1,000 mg by mouth every 6 (six) hours as needed for mild pain.     ceFAZolin 2-4 GM/100ML-% IVPB  Commonly known as:  ANCEF  Inject 100 mLs (2 g total) into the vein every 8 (eight) hours.     dexamethasone 1 MG tablet  Commonly known as:  DECADRON  Take 2 mg by mouth 2 (two) times daily with a meal. Patient is taking 2 pills at one time per wife.     fentaNYL 25 MCG/HR patch  Commonly known as:  DURAGESIC  Place 1 patch (25 mcg total) onto the skin every 3 (three) days.     furosemide 40 MG tablet  Commonly known as:  LASIX  Take 1 tablet (40 mg total) by mouth daily.     losartan 50 MG tablet  Commonly known as:  COZAAR  Take 50 mg by mouth daily after supper.     metFORMIN 500 MG tablet  Commonly known as:  GLUCOPHAGE  Take 500 mg by mouth 2 (two) times daily with a meal.     oxyCODONE 5 MG immediate release tablet  Commonly known as:  Oxy IR/ROXICODONE  Take 1 tablet (5 mg total) by mouth every 4 (four) hours as needed for moderate pain.     traMADol 50 MG tablet  Commonly known as:  ULTRAM  Take 50 mg by mouth every 6 (six) hours as needed for moderate pain.  Vitamin D3 5000 units Caps  Take 5,000 Units by mouth daily.        No orders of the defined types were placed in this encounter.     There is no immunization history on file for this patient.  Social History  Substance Use Topics  . Smoking status: Former Smoker -- 0.50 packs/day for 25 years    Types: Cigarettes  . Smokeless tobacco: Never Used  . Alcohol Use: 1.8 oz/week    3 Cans of beer per week     Comment: occassionally    Family history is   Family History  Problem Relation Age of Onset  . Lymphoma Mother   . Lung cancer Father   . Throat cancer Brother       Review of Systems  DATA OBTAINED: from patient GENERAL:  no fevers, fatigue, appetite changes SKIN: No itching, rash or wounds EYES: No eye pain, redness,  discharge EARS: No earache, tinnitus, change in hearing NOSE: No congestion, drainage or bleeding  MOUTH/THROAT: No mouth or tooth pain, No sore throat RESPIRATORY: No cough, wheezing, SOB CARDIAC: No chest pain, palpitations, chronic lower extremity edema  GI: No abdominal pain, No N/V/D or constipation, No heartburn or reflux  GU: No dysuria, frequency or urgency, or incontinence  MUSCULOSKELETAL: No unrelieved bone/joint pain NEUROLOGIC: No headache, dizziness or focal weakness PSYCHIATRIC: No c/o anxiety or sadness   Filed Vitals:   10/01/15 0908  BP: 113/71  Pulse: 76  Temp: 97.1 F (36.2 C)  Resp: 18    SpO2 Readings from Last 1 Encounters:  09/27/15 94%        Physical Exam  GENERAL APPEARANCE: Alert, conversant,  No acute distress.  SKIN: No diaphoresis rash HEAD: Normocephalic, atraumatic  EYES: Conjunctiva/lids clear. Pupils round, reactive. EOMs intact.  EARS: External exam WNL, canals clear. Hearing grossly normal.  NOSE: No deformity or discharge.  MOUTH/THROAT: Lips w/o lesions  RESPIRATORY: Breathing is even, unlabored. Lung sounds are clear   CARDIOVASCULAR: Heart RRR no murmurs, rubs or gallops; large lymphedema with 1+ edema  GASTROINTESTINAL: Abdomen is soft, non-tender, not distended w/ normal bowel sounds. GENITOURINARY: Bladder non tender, not distended  MUSCULOSKELETAL: No abnormal joints or musculature NEUROLOGIC:  Cranial nerves 2-12 grossly intact. Moves all extremities  PSYCHIATRIC: Mood and affect appropriate to situation, no behavioral issues  Patient Active Problem List   Diagnosis Date Noted  . Systolic CHF ([place]) 123456  . Diabetes type 2, controlled ([place]) 10/05/2015  . Hyponatremia 10/05/2015  . Vitamin D deficiency 10/05/2015  . Staph aureus infection   . Essential hypertension, benign   . Lymphedema   . Acute confusional state   . Neoplasm related pain   . Palliative care encounter   . Goals of care, counseling/discussion    . Staphylococcus aureus bacteremia with sepsis ([place])   . Septic shock ([place])   . Leg pain, right   . Pleural effusion   . Infected venous access port   . Hypotension 09/19/2015  . Right thigh pain   . Diffuse large B-cell lymphoma ([place])   . Cancer associated pain   . Arterial hypotension   . Lactic acid acidosis        Component Value Date/Time   WBC 10.5 09/24/2015 0325   WBC 10.5 09/24/2015   RBC 3.19* 09/24/2015 0325   HGB 9.5* 09/24/2015 0325   HCT 28.5* 09/24/2015 0325   PLT 162 09/24/2015 0325   MCV 89.3 09/24/2015 0325   LYMPHSABS 0.1* 09/19/2015  0416   MONOABS 0.2 09/19/2015 0416   EOSABS 0.0 09/19/2015 0416   BASOSABS 0.0 09/19/2015 0416        Component Value Date/Time   NA 131* 09/24/2015 0325   NA 131* 09/24/2015   K 4.5 09/24/2015 0325   CL 100* 09/24/2015 0325   CO2 23 09/24/2015 0325   GLUCOSE 201* 09/24/2015 0325   BUN 26* 09/24/2015 0325   BUN 26* 09/24/2015   CREATININE 0.64 09/26/2015 0537   CREATININE 0.8 09/24/2015   CALCIUM 8.8* 09/24/2015 0325   PROT 6.1* 09/24/2015 0325   ALBUMIN 3.0* 09/24/2015 0325   AST 43* 09/24/2015 0325   ALT 83* 09/24/2015 0325   ALKPHOS 62 09/24/2015 0325   BILITOT 0.8 09/24/2015 0325   GFRNONAA >60 09/26/2015 0537   GFRAA >60 09/26/2015 0537    No results found for: HGBA1C  No results found for: CHOL, HDL, LDLCALC, LDLDIRECT, TRIG, CHOLHDL   No results found.  Not all labs, radiology exams or other studies done during hospitalization come through on my EPIC note; however they are reviewed by me.    Assessment and Plan  Staphylococcus aureus bacteremia with sepsis ([place]) Sepsis resolved. Off fluids. TEE negative for vegetation. Port-A-Cath removed which is likely the source of infection.. Repeat blood cultures without further growth. Antibiotic switched to Ancef with plan on 2 weeks course. SNF -  PICC line in place. Ancef until stop date of 7/25.; pt admitted for strengthening as well  Right leg  pain Suspected due to lymphoma deep to the right gluteus.  SNF - cont on fentanyl patch and when necessary oxycodone; Patient was also on tramadol and low-dose Decadron  for pain which will be continued.   Diffuse large B-cell lymphoma ([place]) Follows with oncologist at [facility]. He should follow-up with his oncologist in 2 weeks after discharge.  Chronic lymphedema. Likely contributed by his lymphoma. Added daily Lasix. SNF - cont lasix 40 mg daily  Essential hypertension Hypotension with sepsis resolved.  SNF - Resumed  Losartan 50 mg daily: cont  daily Lasix 40 mg  Mild systolic CHF No signs or symptoms. Echo showing decreased LV function of 40-45%. SNF - Continue ARB. If blood pressure remains stable as will add beta blocker. Lasix 40 mg for lymphedema is continued also  Type 2 diabetes mellitus Stable.  SNF - Resume metformin; will follow am BS  Hyponatremia Possibly due to dehydration and sepsis. SNF -  Follow up BMP  VITAMIN D DEF SNF - cont replacement 5000 u daily    Time spent > 45 min;> 50% of time with patient was spent reviewing records, labs, tests and studies, counseling and developing plan of care  Webb Silversmith D. Sheppard Coil, MD

## 2015-10-02 LAB — BASIC METABOLIC PANEL
BUN: 9 mg/dL (ref 4–21)
Creatinine: 0.7 mg/dL (ref 0.6–1.3)
Glucose: 78 mg/dL
Potassium: 3.6 mmol/L (ref 3.4–5.3)
SODIUM: 134 mmol/L — AB (ref 137–147)

## 2015-10-02 LAB — HEPATIC FUNCTION PANEL
ALK PHOS: 53 U/L (ref 25–125)
ALT: 12 U/L (ref 10–40)
AST: 20 U/L (ref 14–40)
BILIRUBIN, TOTAL: 0.4 mg/dL

## 2015-10-02 LAB — CBC AND DIFFERENTIAL
HEMATOCRIT: 26 % — AB (ref 41–53)
HEMOGLOBIN: 8.5 g/dL — AB (ref 13.5–17.5)
PLATELETS: 183 10*3/uL (ref 150–399)
WBC: 8.5 10^3/mL

## 2015-10-05 DIAGNOSIS — E119 Type 2 diabetes mellitus without complications: Secondary | ICD-10-CM | POA: Insufficient documentation

## 2015-10-05 DIAGNOSIS — E871 Hypo-osmolality and hyponatremia: Secondary | ICD-10-CM | POA: Insufficient documentation

## 2015-10-05 DIAGNOSIS — E559 Vitamin D deficiency, unspecified: Secondary | ICD-10-CM | POA: Insufficient documentation

## 2015-10-05 DIAGNOSIS — I5020 Unspecified systolic (congestive) heart failure: Secondary | ICD-10-CM | POA: Insufficient documentation

## 2015-10-16 ENCOUNTER — Encounter: Payer: Self-pay | Admitting: Internal Medicine

## 2015-10-16 ENCOUNTER — Non-Acute Institutional Stay (SKILLED_NURSING_FACILITY): Payer: BLUE CROSS/BLUE SHIELD | Admitting: Internal Medicine

## 2015-10-16 DIAGNOSIS — C8338 Diffuse large B-cell lymphoma, lymph nodes of multiple sites: Secondary | ICD-10-CM | POA: Diagnosis not present

## 2015-10-16 NOTE — Progress Notes (Signed)
MRN: [id number] Name: Martin Norton  Sex: male Age: 60 y.o. DOB: September 16, 1955  [place] #:  Facility/Room: [place] / 108 P Level Of Care: SNF Provider: Noah Delaine. Sheppard Coil, MD Emergency Contacts: Extended Emergency Contact Information Primary Emergency Contact: [place] of [place] Phone: [phone] Relation: Brother Secondary Emergency Contact: Edilia Bo Address: [address] [place], [place] [address] Johnnette Litter of [place] Phone: [phone] Relation: Significant other  Code Status: DNR  Allergies: Zosyn [piperacillin sod-tazobactam so]  Chief Complaint  Patient presents with  . Acute Visit    Acute    HPI: Patient is 60 y.o. male who [place] asked me to see. Insurance has cut him but he wants to stay on and be signed up with Hospice. Pt is c/o pain and says he was doing better on steroids.  Past Medical History:  Diagnosis Date  . Cancer ([place]) 11/2013   NHL, B cell lymphoma  . Diabetes mellitus without complication ([place])   . Hypertension     Past Surgical History:  Procedure Laterality Date  . LYMPH NODE BIOPSY    . PORT-A-CATH REMOVAL N/A 09/21/2015   Procedure: REMOVAL PORT-A-CATH;  Surgeon: Alphonsa Overall, MD;  Location: [facility] ORS;  Service: General;  Laterality: N/A;  . TEE WITHOUT CARDIOVERSION N/A 09/24/2015   Procedure: TRANSESOPHAGEAL ECHOCARDIOGRAM (TEE);  Surgeon: Sueanne Margarita, MD;  Location: [facility] ENDOSCOPY;  Service: Cardiovascular;  Laterality: N/A;      Medication List       Accurate as of 10/16/15 11:59 PM. Always use your most recent med list.          acetaminophen 500 MG tablet Commonly known as:  TYLENOL Take 1,000 mg by mouth every 6 (six) hours as needed for mild pain.   CeFAZolin in Sodium Chloride 2-0.9 GM/100ML-% Soln Inject 100 mLs into the vein every 8 (eight) hours. (2 g total)   dexamethasone 1 MG tablet Commonly known as:  DECADRON Take 2 mg by mouth 2 (two) times daily.   feeding supplement  (GLUCERNA SHAKE) Liqd Take 237 mLs by mouth 2 (two) times daily between meals.   fentaNYL 25 MCG/HR patch Commonly known as:  DURAGESIC Place 1 patch (25 mcg total) onto the skin every 3 (three) days.   furosemide 40 MG tablet Commonly known as:  LASIX Take 1 tablet (40 mg total) by mouth daily.   losartan 50 MG tablet Commonly known as:  COZAAR Take 50 mg by mouth daily after supper.   metFORMIN 500 MG tablet Commonly known as:  GLUCOPHAGE Take 500 mg by mouth 2 (two) times daily with a meal.   oxyCODONE 5 MG immediate release tablet Commonly known as:  Oxy IR/ROXICODONE Take 1 tablet (5 mg total) by mouth every 4 (four) hours as needed for moderate pain.   traMADol 50 MG tablet Commonly known as:  ULTRAM Take 50 mg by mouth every 6 (six) hours as needed for moderate pain.   Vitamin D3 5000 units Caps Take 5,000 Units by mouth daily.       Meds ordered this encounter  Medications  . feeding supplement, GLUCERNA SHAKE, (GLUCERNA SHAKE) LIQD    Sig: Take 237 mLs by mouth 2 (two) times daily between meals.  Marland Kitchen DISCONTD: CeFAZolin in Sodium Chloride 2-0.9 GM/100ML-% SOLN    Sig: Inject 100 mLs into the vein every 8 (eight) hours. (2 g total)    Immunization History  Administered Date(s) Administered  .  PPD Test 09/27/2015    Social History  Substance Use Topics  . Smoking status: Former Smoker    Packs/day: 0.50    Years: 25.00    Types: Cigarettes  . Smokeless tobacco: Never Used     Comment: quit prior to July hospitalization  . Alcohol use No     Comment: occassionally until 6 months ago    Review of Systems  DATA OBTAINED: from patient, nurse GENERAL:  no fevers, fatigue, appetite changes; c/o pain SKIN: No itching, rash HEENT: No complaint RESPIRATORY: No cough, wheezing, SOB CARDIAC: No chest pain, palpitations, lower extremity edema  GI: No abdominal pain, No N/V or constipation + diarrhea, No heartburn or reflux  GU: No dysuria, frequency or  urgency, or incontinence  MUSCULOSKELETAL:+ pain NEUROLOGIC: No headache, dizziness  PSYCHIATRIC: No overt anxiety or sadness  Vitals:   10/16/15 1051  BP: 140/73  Pulse: 71  Resp: 20  Temp: 97.8 F (36.6 C)    Physical Exam  GENERAL APPEARANCE: Alert, conversant, appears to be uncomfortable  SKIN: No diaphoresis rash HEENT: Unremarkable RESPIRATORY: Breathing is even, unlabored. Lung sounds are clear   CARDIOVASCULAR: Heart RRR no murmurs, rubs or gallops. No peripheral edema  GASTROINTESTINAL: Abdomen is soft, non-tender, not distended w/ normal bowel sounds.  GENITOURINARY: Bladder non tender, not distended  MUSCULOSKELETAL: No abnormal joints or musculature NEUROLOGIC: Cranial nerves 2-12 grossly intact. Moves all extremities PSYCHIATRIC: Mood and affect appropriate to situation, no behavioral issues  Patient Active Problem List   Diagnosis Date Noted  . Diffuse large B cell lymphoma ([place]) 10/22/2015  . Systolic CHF ([place]) 123456  . Diabetes type 2, controlled ([place]) 10/05/2015  . Hyponatremia 10/05/2015  . Vitamin D deficiency 10/05/2015  . Staph aureus infection   . Essential hypertension, benign   . Lymphedema   . Acute confusional state   . Neoplasm related pain   . Palliative care encounter   . Goals of care, counseling/discussion   . Staphylococcus aureus bacteremia with sepsis ([place])   . Septic shock ([place])   . Leg pain, right   . Pleural effusion   . Infected venous access port   . Hypotension 09/19/2015  . Right thigh pain   . Diffuse large B-cell lymphoma ([place])   . Cancer associated pain   . Arterial hypotension   . Lactic acid acidosis     CBC    Component Value Date/Time   WBC 25.6 (H) 10/22/2015 0111   RBC 2.90 (L) 10/30/2015 0111   HGB 8.7 (L) 11/06/2015 0111   HCT 24.8 (L) 11/13/2015 0111   PLT 239 10/31/2015 0111   MCV 85.5 11/07/2015 0111   LYMPHSABS 0.8 10/22/2015 0111   MONOABS 0.5 10/16/2015 0111   EOSABS 0.0 11/10/2015 0111    BASOSABS 0.0 10/21/2015 0111    CMP     Component Value Date/Time   NA 114 (LL) 10/24/2015 0111   NA 134 (A) 10/02/2015   K 4.5 11/13/2015 0111   CL 78 (L) 10/16/2015 0111   CO2 23 11/02/2015 0111   GLUCOSE 104 (H) 11/01/2015 0111   BUN 46 (H) 11/02/2015 0111   BUN 9 10/02/2015   CREATININE 1.67 (H) 11/14/2015 0111   CALCIUM 7.9 (L) 10/29/2015 0111   PROT 6.1 (L) 11/02/2015 0111   ALBUMIN 2.6 (L) 10/29/2015 0111   AST 29 11/06/2015 0111   ALT 23 10/17/2015 0111   ALKPHOS 96 11/03/2015 0111   BILITOT 0.8 10/21/2015 0111   GFRNONAA 43 (L) 10/18/2015  0111   GFRAA 49 (L) 11/06/2015 0111    Assessment and Plan  Diffuse large B cell lymphoma ([place]) Spoke with pt at length, he does want Hospice now, he did not last week during a conversation. He has had diarrhea, will check for C diff. Pt's pain is not controlled; will inc Fentanyl patch to 50 mcg and start getting schedules oxycodone at night when pt needs it most, in addition to q6 prn; Hospice consult requested  Time spent > 35 min;> 50% of time with patient was spent reviewing records, labs, tests and studies, counseling and developing plan of care  Webb Silversmith D. Sheppard Coil, MD

## 2015-10-19 ENCOUNTER — Encounter (HOSPITAL_COMMUNITY): Payer: Self-pay

## 2015-10-19 ENCOUNTER — Emergency Department (HOSPITAL_COMMUNITY)

## 2015-10-19 ENCOUNTER — Inpatient Hospital Stay (HOSPITAL_COMMUNITY)
Admission: EM | Admit: 2015-10-19 | Discharge: 2015-11-15 | DRG: 177 | Disposition: E | Attending: Family Medicine | Admitting: Family Medicine

## 2015-10-19 DIAGNOSIS — J690 Pneumonitis due to inhalation of food and vomit: Secondary | ICD-10-CM | POA: Diagnosis not present

## 2015-10-19 DIAGNOSIS — Z807 Family history of other malignant neoplasms of lymphoid, hematopoietic and related tissues: Secondary | ICD-10-CM

## 2015-10-19 DIAGNOSIS — Y95 Nosocomial condition: Secondary | ICD-10-CM | POA: Diagnosis present

## 2015-10-19 DIAGNOSIS — G893 Neoplasm related pain (acute) (chronic): Secondary | ICD-10-CM | POA: Diagnosis present

## 2015-10-19 DIAGNOSIS — Z79899 Other long term (current) drug therapy: Secondary | ICD-10-CM

## 2015-10-19 DIAGNOSIS — C8330 Diffuse large B-cell lymphoma, unspecified site: Secondary | ICD-10-CM | POA: Diagnosis not present

## 2015-10-19 DIAGNOSIS — R339 Retention of urine, unspecified: Secondary | ICD-10-CM | POA: Diagnosis not present

## 2015-10-19 DIAGNOSIS — Z801 Family history of malignant neoplasm of trachea, bronchus and lung: Secondary | ICD-10-CM

## 2015-10-19 DIAGNOSIS — N179 Acute kidney failure, unspecified: Secondary | ICD-10-CM | POA: Diagnosis present

## 2015-10-19 DIAGNOSIS — Y92129 Unspecified place in nursing home as the place of occurrence of the external cause: Secondary | ICD-10-CM

## 2015-10-19 DIAGNOSIS — Z808 Family history of malignant neoplasm of other organs or systems: Secondary | ICD-10-CM | POA: Diagnosis not present

## 2015-10-19 DIAGNOSIS — I890 Lymphedema, not elsewhere classified: Secondary | ICD-10-CM | POA: Diagnosis present

## 2015-10-19 DIAGNOSIS — W06XXXA Fall from bed, initial encounter: Secondary | ICD-10-CM | POA: Diagnosis present

## 2015-10-19 DIAGNOSIS — G9340 Encephalopathy, unspecified: Secondary | ICD-10-CM | POA: Diagnosis present

## 2015-10-19 DIAGNOSIS — R0681 Apnea, not elsewhere classified: Secondary | ICD-10-CM | POA: Diagnosis not present

## 2015-10-19 DIAGNOSIS — A4901 Methicillin susceptible Staphylococcus aureus infection, unspecified site: Secondary | ICD-10-CM | POA: Diagnosis present

## 2015-10-19 DIAGNOSIS — E119 Type 2 diabetes mellitus without complications: Secondary | ICD-10-CM | POA: Diagnosis not present

## 2015-10-19 DIAGNOSIS — Z7189 Other specified counseling: Secondary | ICD-10-CM | POA: Diagnosis not present

## 2015-10-19 DIAGNOSIS — I10 Essential (primary) hypertension: Secondary | ICD-10-CM | POA: Diagnosis present

## 2015-10-19 DIAGNOSIS — F05 Delirium due to known physiological condition: Secondary | ICD-10-CM | POA: Diagnosis present

## 2015-10-19 DIAGNOSIS — C8338 Diffuse large B-cell lymphoma, lymph nodes of multiple sites: Secondary | ICD-10-CM | POA: Diagnosis not present

## 2015-10-19 DIAGNOSIS — Z87891 Personal history of nicotine dependence: Secondary | ICD-10-CM | POA: Diagnosis not present

## 2015-10-19 DIAGNOSIS — Z79891 Long term (current) use of opiate analgesic: Secondary | ICD-10-CM

## 2015-10-19 DIAGNOSIS — Z515 Encounter for palliative care: Secondary | ICD-10-CM | POA: Diagnosis present

## 2015-10-19 DIAGNOSIS — E871 Hypo-osmolality and hyponatremia: Secondary | ICD-10-CM | POA: Diagnosis present

## 2015-10-19 DIAGNOSIS — R4182 Altered mental status, unspecified: Secondary | ICD-10-CM | POA: Diagnosis not present

## 2015-10-19 DIAGNOSIS — Z7952 Long term (current) use of systemic steroids: Secondary | ICD-10-CM

## 2015-10-19 DIAGNOSIS — Z66 Do not resuscitate: Secondary | ICD-10-CM | POA: Diagnosis present

## 2015-10-19 DIAGNOSIS — J189 Pneumonia, unspecified organism: Secondary | ICD-10-CM

## 2015-10-19 DIAGNOSIS — Z7984 Long term (current) use of oral hypoglycemic drugs: Secondary | ICD-10-CM

## 2015-10-19 HISTORY — DX: Essential (primary) hypertension: I10

## 2015-10-19 LAB — CBC WITH DIFFERENTIAL/PLATELET
BASOS PCT: 0 %
Basophils Absolute: 0 10*3/uL (ref 0.0–0.1)
EOS ABS: 0 10*3/uL (ref 0.0–0.7)
Eosinophils Relative: 0 %
HCT: 24.8 % — ABNORMAL LOW (ref 39.0–52.0)
HEMOGLOBIN: 8.7 g/dL — AB (ref 13.0–17.0)
LYMPHS PCT: 3 %
Lymphs Abs: 0.8 10*3/uL (ref 0.7–4.0)
MCH: 30 pg (ref 26.0–34.0)
MCHC: 35.1 g/dL (ref 30.0–36.0)
MCV: 85.5 fL (ref 78.0–100.0)
MONO ABS: 0.5 10*3/uL (ref 0.1–1.0)
Monocytes Relative: 2 %
NEUTROS ABS: 24.3 10*3/uL — AB (ref 1.7–7.7)
Neutrophils Relative %: 95 %
Platelets: 239 10*3/uL (ref 150–400)
RBC: 2.9 MIL/uL — ABNORMAL LOW (ref 4.22–5.81)
RDW: 15.2 % (ref 11.5–15.5)
WBC Morphology: INCREASED
WBC: 25.6 10*3/uL — AB (ref 4.0–10.5)

## 2015-10-19 LAB — BLOOD CULTURE ID PANEL (REFLEXED)
ACINETOBACTER BAUMANNII: NOT DETECTED
CANDIDA ALBICANS: NOT DETECTED
CANDIDA GLABRATA: NOT DETECTED
Candida krusei: NOT DETECTED
Candida parapsilosis: NOT DETECTED
Candida tropicalis: NOT DETECTED
Carbapenem resistance: NOT DETECTED
ENTEROBACTER CLOACAE COMPLEX: NOT DETECTED
ESCHERICHIA COLI: NOT DETECTED
Enterobacteriaceae species: NOT DETECTED
Enterococcus species: NOT DETECTED
HAEMOPHILUS INFLUENZAE: NOT DETECTED
Klebsiella oxytoca: NOT DETECTED
Klebsiella pneumoniae: NOT DETECTED
Listeria monocytogenes: NOT DETECTED
METHICILLIN RESISTANCE: NOT DETECTED
NEISSERIA MENINGITIDIS: NOT DETECTED
PSEUDOMONAS AERUGINOSA: NOT DETECTED
Proteus species: NOT DETECTED
STREPTOCOCCUS PNEUMONIAE: NOT DETECTED
STREPTOCOCCUS PYOGENES: NOT DETECTED
STREPTOCOCCUS SPECIES: NOT DETECTED
Serratia marcescens: NOT DETECTED
Staphylococcus aureus (BCID): DETECTED — AB
Staphylococcus species: DETECTED — AB
Streptococcus agalactiae: NOT DETECTED
Vancomycin resistance: NOT DETECTED

## 2015-10-19 LAB — URINALYSIS, ROUTINE W REFLEX MICROSCOPIC
Glucose, UA: NEGATIVE mg/dL
Hgb urine dipstick: NEGATIVE
KETONES UR: NEGATIVE mg/dL
LEUKOCYTES UA: NEGATIVE
NITRITE: NEGATIVE
PH: 5.5 (ref 5.0–8.0)
PROTEIN: NEGATIVE mg/dL
Specific Gravity, Urine: 1.016 (ref 1.005–1.030)

## 2015-10-19 LAB — COMPREHENSIVE METABOLIC PANEL
ALK PHOS: 96 U/L (ref 38–126)
ALT: 23 U/L (ref 17–63)
ANION GAP: 13 (ref 5–15)
AST: 29 U/L (ref 15–41)
Albumin: 2.6 g/dL — ABNORMAL LOW (ref 3.5–5.0)
BUN: 46 mg/dL — ABNORMAL HIGH (ref 6–20)
CALCIUM: 7.9 mg/dL — AB (ref 8.9–10.3)
CO2: 23 mmol/L (ref 22–32)
Chloride: 78 mmol/L — ABNORMAL LOW (ref 101–111)
Creatinine, Ser: 1.67 mg/dL — ABNORMAL HIGH (ref 0.61–1.24)
GFR calc non Af Amer: 43 mL/min — ABNORMAL LOW (ref >60)
GFR, EST AFRICAN AMERICAN: 49 mL/min — AB (ref >60)
Glucose, Bld: 104 mg/dL — ABNORMAL HIGH (ref 65–99)
Potassium: 4.5 mmol/L (ref 3.5–5.1)
SODIUM: 114 mmol/L — AB (ref 135–145)
TOTAL PROTEIN: 6.1 g/dL — AB (ref 6.5–8.1)
Total Bilirubin: 0.8 mg/dL (ref 0.3–1.2)

## 2015-10-19 LAB — I-STAT CG4 LACTIC ACID, ED: LACTIC ACID, VENOUS: 1.37 mmol/L (ref 0.5–1.9)

## 2015-10-19 LAB — MRSA PCR SCREENING: MRSA BY PCR: NEGATIVE

## 2015-10-19 MED ORDER — POLYVINYL ALCOHOL 1.4 % OP SOLN: 1.0000 [drp] | Freq: Four times a day (QID) | OPHTHALMIC | Status: DC | PRN

## 2015-10-19 MED ORDER — MORPHINE SULFATE 25 MG/ML IV SOLN
5.0000 mg/h | INTRAVENOUS | Status: DC
  Administered 2015-10-19 – 2015-10-21 (×3): 5 mg/h via INTRAVENOUS
  Filled 2015-10-19 (×3): qty 10

## 2015-10-19 MED ORDER — LORAZEPAM 1 MG PO TABS: 1.0000 mg | ORAL_TABLET | ORAL | Status: DC | PRN

## 2015-10-19 MED ORDER — GLYCOPYRROLATE 0.2 MG/ML IJ SOLN
0.2000 mg | INTRAMUSCULAR | Status: DC | PRN
  Filled 2015-10-19: qty 1

## 2015-10-19 MED ORDER — BISACODYL 10 MG RE SUPP: 10.0000 mg | Freq: Every day | RECTAL | Status: DC | PRN

## 2015-10-19 MED ORDER — HALOPERIDOL LACTATE 5 MG/ML IJ SOLN: 0.5000 mg | INTRAMUSCULAR | Status: DC | PRN

## 2015-10-19 MED ORDER — CEFEPIME HCL 1 G IJ SOLR
1.0000 g | Freq: Two times a day (BID) | INTRAMUSCULAR | Status: DC
  Filled 2015-10-19: qty 1

## 2015-10-19 MED ORDER — DEXAMETHASONE SODIUM PHOSPHATE 4 MG/ML IJ SOLN
4.0000 mg | Freq: Three times a day (TID) | INTRAMUSCULAR | Status: AC
  Administered 2015-10-19 – 2015-10-20 (×5): 4 mg via INTRAVENOUS
  Filled 2015-10-19 (×5): qty 1

## 2015-10-19 MED ORDER — GLYCOPYRROLATE 1 MG PO TABS
1.0000 mg | ORAL_TABLET | ORAL | Status: DC | PRN
  Filled 2015-10-19: qty 1

## 2015-10-19 MED ORDER — SODIUM CHLORIDE 0.9% FLUSH: 3.0000 mL | INTRAVENOUS | Status: DC | PRN

## 2015-10-19 MED ORDER — HALOPERIDOL 0.5 MG PO TABS
0.5000 mg | ORAL_TABLET | ORAL | Status: DC | PRN
  Filled 2015-10-19: qty 1

## 2015-10-19 MED ORDER — FENTANYL CITRATE (PF) 100 MCG/2ML IJ SOLN
25.0000 ug | Freq: Once | INTRAMUSCULAR | Status: AC
  Administered 2015-10-19: 25 ug via INTRAVENOUS
  Filled 2015-10-19: qty 2

## 2015-10-19 MED ORDER — SODIUM CHLORIDE 0.9 % IV SOLN: 250.0000 mL | INTRAVENOUS | Status: DC | PRN

## 2015-10-19 MED ORDER — ONDANSETRON HCL 4 MG/2ML IJ SOLN: 4.0000 mg | Freq: Four times a day (QID) | INTRAMUSCULAR | Status: DC | PRN

## 2015-10-19 MED ORDER — DEXAMETHASONE 4 MG PO TABS: 4.0000 mg | ORAL_TABLET | Freq: Three times a day (TID) | ORAL | Status: AC

## 2015-10-19 MED ORDER — LORAZEPAM 2 MG/ML IJ SOLN
1.0000 mg | INTRAMUSCULAR | Status: DC | PRN
  Administered 2015-10-20 – 2015-10-21 (×2): 1 mg via INTRAVENOUS
  Filled 2015-10-19 (×2): qty 1

## 2015-10-19 MED ORDER — FLEET ENEMA 7-19 GM/118ML RE ENEM: 1.0000 | ENEMA | Freq: Every day | RECTAL | Status: DC | PRN

## 2015-10-19 MED ORDER — SODIUM CHLORIDE 0.9 % IV BOLUS (SEPSIS)
500.0000 mL | Freq: Once | INTRAVENOUS | Status: AC
  Administered 2015-10-19: 500 mL via INTRAVENOUS

## 2015-10-19 MED ORDER — MORPHINE BOLUS VIA INFUSION
4.0000 mg | INTRAVENOUS | Status: DC | PRN
  Administered 2015-10-19 – 2015-10-21 (×16): 4 mg via INTRAVENOUS
  Filled 2015-10-19: qty 4

## 2015-10-19 MED ORDER — FENTANYL 50 MCG/HR TD PT72
50.0000 ug | MEDICATED_PATCH | TRANSDERMAL | Status: DC
  Administered 2015-10-19 – 2015-10-22 (×2): 50 ug via TRANSDERMAL
  Filled 2015-10-19 (×2): qty 1

## 2015-10-19 MED ORDER — LORAZEPAM 2 MG/ML PO CONC: 1.0000 mg | ORAL | Status: DC | PRN

## 2015-10-19 MED ORDER — HALOPERIDOL LACTATE 2 MG/ML PO CONC
0.5000 mg | ORAL | Status: DC | PRN
  Filled 2015-10-19: qty 0.3

## 2015-10-19 MED ORDER — DEXTROSE 5 % IV SOLN
2.0000 g | INTRAVENOUS | Status: AC
  Administered 2015-10-19: 2 g via INTRAVENOUS
  Filled 2015-10-19: qty 2

## 2015-10-19 MED ORDER — SODIUM CHLORIDE 0.9% FLUSH
3.0000 mL | Freq: Two times a day (BID) | INTRAVENOUS | Status: DC
  Administered 2015-10-19 – 2015-10-20 (×2): 3 mL via INTRAVENOUS

## 2015-10-19 MED ORDER — VITAMINS A & D EX OINT
TOPICAL_OINTMENT | CUTANEOUS | Status: AC
  Administered 2015-10-19: 12:00:00
  Filled 2015-10-19: qty 5

## 2015-10-19 MED ORDER — ONDANSETRON 4 MG PO TBDP: 4.0000 mg | ORAL_TABLET | Freq: Four times a day (QID) | ORAL | Status: DC | PRN

## 2015-10-19 MED ORDER — VANCOMYCIN HCL IN DEXTROSE 750-5 MG/150ML-% IV SOLN
750.0000 mg | Freq: Two times a day (BID) | INTRAVENOUS | Status: DC
  Administered 2015-10-19: 750 mg via INTRAVENOUS
  Filled 2015-10-19 (×2): qty 150

## 2015-10-19 MED ORDER — DIPHENHYDRAMINE HCL 50 MG/ML IJ SOLN: 12.5000 mg | INTRAMUSCULAR | Status: DC | PRN

## 2015-10-19 MED ORDER — ALBUTEROL SULFATE (2.5 MG/3ML) 0.083% IN NEBU: 2.5000 mg | INHALATION_SOLUTION | RESPIRATORY_TRACT | Status: DC | PRN

## 2015-10-19 NOTE — ED Notes (Signed)
Bed: [id number] Expected date:  Expected time:  Means of arrival:  Comments: EMS 60yo M increased confusion

## 2015-10-19 NOTE — ED Provider Notes (Signed)
[facility] DEPT Provider Note   CSN: [id number] Arrival date & time: 11/01/2015  0010  By signing my name below, I, Gwenlyn Fudge, attest that this documentation has been prepared under the direction and in the presence of Orpah Greek, MD. Electronically Signed: Gwenlyn Fudge, ED Scribe. 11/08/2015. 12:26 AM.  First MD Initiated Contact with Patient 10/25/2015 0017    History   Chief Complaint Chief Complaint  Patient presents with  . Altered Mental Status  . Fall   The history is provided by a relative and the patient. No language interpreter was used.    HPI Comments: Martin Norton is a 60 y.o. male with PMHx of Cancer and DM who presents to the Emergency Department s/p fall earlier tonight. Pt also has increased confusion over the last week. Pt was last seen normal at 2 PM yesterday. Pt was transferred to skilled nursing today. Pt does not have any signs of injury from the fall. Pt denies any acute distress. Pt has become less and less responsive over the last week. Pt has also had his pain medications increased over the last week.   Past Medical History:  Diagnosis Date  . Cancer ([place])   . Diabetes mellitus without complication [facility])     Patient Active Problem List   Diagnosis Date Noted  . Systolic CHF ([place]) 123456  . Diabetes type 2, controlled ([place]) 10/05/2015  . Hyponatremia 10/05/2015  . Vitamin D deficiency 10/05/2015  . Staph aureus infection   . Essential hypertension, benign   . Lymphedema   . Acute confusional state   . Neoplasm related pain   . Palliative care encounter   . Goals of care, counseling/discussion   . Staphylococcus aureus bacteremia with sepsis ([place])   . Septic shock ([place])   . Leg pain, right   . Pleural effusion   . Infected venous access port   . Hypotension 09/19/2015  . Right thigh pain   . Diffuse large B-cell lymphoma ([place])   . Cancer associated pain   . Arterial hypotension   . Lactic acid acidosis     Past Surgical  History:  Procedure Laterality Date  . LYMPH NODE BIOPSY    . PORT-A-CATH REMOVAL N/A 09/21/2015   Procedure: REMOVAL PORT-A-CATH;  Surgeon: Alphonsa Overall, MD;  Location: [facility] ORS;  Service: General;  Laterality: N/A;  . TEE WITHOUT CARDIOVERSION N/A 09/24/2015   Procedure: TRANSESOPHAGEAL ECHOCARDIOGRAM (TEE);  Surgeon: Sueanne Margarita, MD;  Location: [facility] ENDOSCOPY;  Service: Cardiovascular;  Laterality: N/A;     Home Medications    Prior to Admission medications   Medication Sig Start Date End Date Taking? Authorizing Provider  acetaminophen (TYLENOL) 500 MG tablet Take 1,000 mg by mouth every 6 (six) hours as needed for mild pain.    Yes Historical Provider, MD  bisacodyl (DULCOLAX) 10 MG suppository Place 10 mg rectally daily as needed for moderate constipation (for constipation not relieved by Milk of Magnesium).   Yes Historical Provider, MD  Cholecalciferol (VITAMIN D3) 5000 UNITS CAPS Take 5,000 Units by mouth daily.   Yes Historical Provider, MD  dexamethasone (DECADRON) 1 MG tablet Take 2 mg by mouth 2 (two) times daily.  08/19/15  Yes Historical Provider, MD  feeding supplement, GLUCERNA SHAKE, (GLUCERNA SHAKE) LIQD Take 237 mLs by mouth 2 (two) times daily between meals.   Yes Historical Provider, MD  fentaNYL (DURAGESIC - DOSED MCG/HR) 50 MCG/HR Place 50 mcg onto the skin every 3 (three) days.   Yes Historical  Provider, MD  furosemide (LASIX) 40 MG tablet Take 1 tablet (40 mg total) by mouth daily. 09/26/15  Yes Nishant Dhungel, MD  losartan (COZAAR) 50 MG tablet Take 50 mg by mouth daily after supper.  06/20/14  Yes Historical Provider, MD  magnesium hydroxide (MILK OF MAGNESIA) 400 MG/5ML suspension Take 30 mLs by mouth daily as needed (for constipation).   Yes Historical Provider, MD  metFORMIN (GLUCOPHAGE) 500 MG tablet Take 500 mg by mouth 2 (two) times daily with a meal. 06/20/14  Yes Historical Provider, MD  oxyCODONE (OXY IR/ROXICODONE) 5 MG immediate release tablet Take 1 tablet (5 mg  total) by mouth every 4 (four) hours as needed for moderate pain. 09/26/15  Yes Nishant Dhungel, MD  oxyCODONE (OXY IR/ROXICODONE) 5 MG immediate release tablet Take 5 mg by mouth 2 (two) times daily. 2000 and 0200.   Yes Historical Provider, MD  promethazine (PHENERGAN) 25 MG tablet Take 25 mg by mouth every 6 (six) hours as needed for nausea or vomiting.   Yes Historical Provider, MD  Sodium Phosphates (RA SALINE ENEMA) 19-7 GM/118ML ENEM Place 1 Bottle rectally daily as needed (for constipation not relieved by Milk of Magnesium and bisacodyl suppository.).   Yes Historical Provider, MD  traMADol (ULTRAM) 50 MG tablet Take 50 mg by mouth every 6 (six) hours as needed for moderate pain.  08/21/15  Yes Historical Provider, MD  fentaNYL (DURAGESIC) 25 MCG/HR patch Place 1 patch (25 mcg total) onto the skin every 3 (three) days. Patient not taking: Reported on 10/22/2015 09/26/15   Louellen Molder, MD    Family History Family History  Problem Relation Age of Onset  . Lymphoma Mother   . Lung cancer Father   . Throat cancer Brother     Social History Social History  Substance Use Topics  . Smoking status: Former Smoker    Packs/day: 0.50    Years: 25.00    Types: Cigarettes  . Smokeless tobacco: Never Used  . Alcohol use 1.8 oz/week    3 Cans of beer per week     Comment: occassionally     Allergies   Zosyn [piperacillin sod-tazobactam so]   Review of Systems Review of Systems  Constitutional: Negative for fever.  Cardiovascular: Positive for leg swelling.  Gastrointestinal: Negative for abdominal pain.  Musculoskeletal: Positive for back pain.  Psychiatric/Behavioral: Positive for confusion.  All other systems reviewed and are negative.    Physical Exam Updated Vital Signs BP 101/59   Pulse 114   Temp 99.2 F (37.3 C) (Rectal)   Resp 16   SpO2 97%   Physical Exam  Constitutional: He appears well-developed and well-nourished. He appears listless. No distress.  HENT:    Head: Normocephalic and atraumatic.  Right Ear: Hearing normal.  Left Ear: Hearing normal.  Nose: Nose normal.  Mouth/Throat: Oropharynx is clear and moist and mucous membranes are normal.  Eyes: Conjunctivae and EOM are normal. Pupils are equal, round, and reactive to light.  Neck: Normal range of motion. Neck supple.  Cardiovascular: Regular rhythm, S1 normal and S2 normal.  Exam reveals no gallop and no friction rub.   No murmur heard. Pulmonary/Chest: Effort normal. No respiratory distress. He has rhonchi in the right lower field and the left lower field. He exhibits no tenderness.  Abdominal: Soft. Normal appearance and bowel sounds are normal. There is no hepatosplenomegaly. There is no tenderness. There is no rebound, no guarding, no tenderness at McBurney's point and negative Murphy's sign. No hernia.  Musculoskeletal: Normal range of motion.  Neurological: He has normal strength. He appears listless. He is disoriented. No cranial nerve deficit or sensory deficit. Coordination normal.  Awakened to voice Answered questions, but was disoriented to place and time  Skin: Skin is warm, dry and intact. No rash noted. No cyanosis.  Psychiatric: He has a normal mood and affect. His speech is normal and behavior is normal. Thought content normal.  Nursing note and vitals reviewed.    ED Treatments / Results  DIAGNOSTIC STUDIES: Oxygen Saturation is 98% on RA, normal by my interpretation.    COORDINATION OF CARE: 12:23 AM Discussed treatment plan with daughter of pt at bedside which includes lab work and daughter agreed to plan.  Labs (all labs ordered are listed, but only abnormal results are displayed) Labs Reviewed  CBC WITH DIFFERENTIAL/PLATELET - Abnormal; Notable for the following:       Result Value   WBC 25.6 (*)    RBC 2.90 (*)    Hemoglobin 8.7 (*)    HCT 24.8 (*)    Neutro Abs 24.3 (*)    All other components within normal limits  COMPREHENSIVE METABOLIC PANEL -  Abnormal; Notable for the following:    Sodium 114 (*)    Chloride 78 (*)    Glucose, Bld 104 (*)    BUN 46 (*)    Creatinine, Ser 1.67 (*)    Calcium 7.9 (*)    Total Protein 6.1 (*)    Albumin 2.6 (*)    GFR calc non Af Amer 43 (*)    GFR calc Af Amer 49 (*)    All other components within normal limits  URINALYSIS, ROUTINE W REFLEX MICROSCOPIC (NOT AT [facility]) - Abnormal; Notable for the following:    Color, Urine AMBER (*)    APPearance CLOUDY (*)    Bilirubin Urine SMALL (*)    All other components within normal limits  URINE CULTURE  CULTURE, BLOOD (ROUTINE X 2)  CULTURE, BLOOD (ROUTINE X 2)  I-STAT CG4 LACTIC ACID, ED    EKG  EKG Interpretation None       Radiology Dg Chest 2 View  Result Date: 11/06/2015 CLINICAL DATA:  Mental status change.  Fall yesterday.  Confusion. EXAM: CHEST  2 VIEW COMPARISON:  Chest radiographs 09/26/2015, 09/19/2015 FINDINGS: Development of right infrahilar opacity from prior exam. Right lower lobe bronchial thickening. Left lung is clear. Heart is normal in size. Previous pleural thickening in the right midlung zone is not as well visualized currently. There is no frank pleural effusion. No pneumothorax. The bones appear under mineralized. IMPRESSION: Development of right infrahilar opacity in right lower lobe bronchial thickening. Considerations include pneumonia or aspiration. Recommend continued radiographic follow-up. Electronically Signed   By: Jeb Levering M.D.   On: 11/05/2015 01:16   Ct Head Wo Contrast  Result Date: 10/29/2015 CLINICAL DATA:  60 year old male with altered mental status EXAM: CT HEAD WITHOUT CONTRAST TECHNIQUE: Contiguous axial images were obtained from the base of the skull through the vertex without intravenous contrast. COMPARISON:  None. FINDINGS: Evaluation of this exam is limited due to motion artifact. The ventricles and sulci appropriate size for patient's age. Minimal periventricular and deep white matter chronic  microvascular ischemic changes noted. There is no acute intracranial hemorrhage. No mass effect or midline shift. There is mild mucoperiosteal thickening of the ethmoid air cells. The remainder of the visualized paranasal sinuses and mastoid air cells are clear. The calvarium is intact. IMPRESSION: No acute  intracranial hemorrhage. Minimal chronic microvascular ischemic disease. If symptoms persist and there are no contraindications, MRI may provide better evaluation if clinically indicated Electronically Signed   By: Anner Crete M.D.   On: 11/03/2015 01:17    Procedures Procedures (including critical care time)  Medications Ordered in ED Medications  vancomycin (VANCOCIN) IVPB 750 mg/150 ml premix (750 mg Intravenous Given 10/17/2015 0301)  ceFEPIme (MAXIPIME) 1 g in dextrose 5 % 50 mL IVPB (not administered)  sodium chloride 0.9 % bolus 500 mL (0 mLs Intravenous Stopped 10/21/2015 0225)  ceFEPIme (MAXIPIME) 2 g in dextrose 5 % 50 mL IVPB (0 g Intravenous Stopped 10/21/2015 0301)  fentaNYL (SUBLIMAZE) injection 25 mcg (25 mcg Intravenous Given 10/29/2015 0243)     Initial Impression / Assessment and Plan / ED Course  I have reviewed the triage vital signs and the nursing notes.  Pertinent labs & imaging results that were available during my care of the patient were reviewed by me and considered in my medical decision making (see chart for details).  Clinical Course  Patient brought to the emergency department from skilled nursing facility. Patient was recently hospitalized earlier this month with sepsis. At time of discharge he was sent to nursing facility for rehabilitation and has recently transitioned to long-term care. Over the last week he has become progressively less active. Family present report that he has been having a lot of pain in his pain medications have been titrated up over the past week as well. Initially it was thought that his altered mental status might be secondary to his pain  medications, however, with recent sepsis, workup was initiated.  Bloodwork reveals profound hyponatremia. Etiology is unclear, patient does take diuretic, however. Family reports that he has not been eating or drinking, likely has some element of acute kidney injury as well with elevated creatinine. He was given IV fluids of normal saline at arrival.  Patient does have evidence of pneumonia on x-ray. With his altered mental status, cannot rule out aspiration. This would at least qualify for healthcare associated pneumonia. Patient reportedly had reaction to Zosyn, cannot receive penicillin antibiotics. Was given cefepime and vancomycin.  Patient is a DNR.   CRITICAL CARE Performed by: Orpah Greek   Total critical care time: 35 minutes  Critical care time was exclusive of separately billable procedures and treating other patients.  Critical care was necessary to treat or prevent imminent or life-threatening deterioration.  Critical care was time spent personally by me on the following activities: development of treatment plan with patient and/or surrogate as well as nursing, discussions with consultants, evaluation of patient's response to treatment, examination of patient, obtaining history from patient or surrogate, ordering and performing treatments and interventions, ordering and review of laboratory studies, ordering and review of radiographic studies, pulse oximetry and re-evaluation of patient's condition.    Final Clinical Impressions(s) / ED Diagnoses   Final diagnoses:  Encephalopathy acute  Hyponatremia  HCAP (healthcare-associated pneumonia)    New Prescriptions New Prescriptions   No medications on file   I personally performed the services described in this documentation, which was scribed in my presence. The recorded information has been reviewed and is accurate.     Orpah Greek, MD 10/30/2015 0330

## 2015-10-19 NOTE — H&P (Signed)
History and Physical    Martin Norton [id number] DOB: [birth year] DOA: 10/15/2015  PCP: None - has been seeing Dr. Sheppard Coil at [facility] recently Consultants:  Oncologist at [facility] (but has been dismissed from care) Patient coming from: SNF  Chief Complaint: fall, altered mental status  HPI: Martin Norton is a 60 y.o. male with medical history significant of diffuse large B-cell lymphoma, end stage.  Patient is accompanied by his girlfriend/HCPOA.  He was admitted for septic shock during last hospitalization, 7/6-7/14.  Was discharged to SNF for rehab.  They found him on the floor at SNF, he had fallen out of bed.  Was just admitted to Hospice today.  HCPOA is in favor of proceeding with comfort care at this time with the understanding that while he may have potentially treatable acute conditions, his terminal diagnosis means that treatment for the acute conditions is likely to cause more harm than benefit.   ED Course:  Progressively less active over the last week with increasing pain despite titration of his pain medication.  Found to have profound hyponatremia (Na 119), likely resulting from no PO intake but ongoing diuretic use; IVF started.  Also with possible HCAP on CXR - given Cefepime and Vanc.  Review of Systems: As per HPI; otherwise 10 point review of systems reviewed and negative.   Ambulatory Status: not ambulatory at this time  Past Medical History:  Diagnosis Date  . Cancer ([place]) 11/2013   NHL, B cell lymphoma  . Diabetes mellitus without complication ([place])   . Hypertension     Past Surgical History:  Procedure Laterality Date  . LYMPH NODE BIOPSY    . PORT-A-CATH REMOVAL N/A 09/21/2015   Procedure: REMOVAL PORT-A-CATH;  Surgeon: Alphonsa Overall, MD;  Location: [facility] ORS;  Service: General;  Laterality: N/A;  . TEE WITHOUT CARDIOVERSION N/A 09/24/2015   Procedure: TRANSESOPHAGEAL ECHOCARDIOGRAM (TEE);  Surgeon: Sueanne Margarita, MD;  Location: [facility] ENDOSCOPY;  Service:  Cardiovascular;  Laterality: N/A;    Social History   Social History  . Marital status: Divorced    Spouse name: N/A  . Number of children: N/A  . Years of education: N/A   Occupational History  . water treatment operator - retired    Social History Main Topics  . Smoking status: Former Smoker    Packs/day: 0.50    Years: 25.00    Types: Cigarettes  . Smokeless tobacco: Never Used     Comment: quit prior to July hospitalization  . Alcohol use No     Comment: occassionally until 6 months ago  . Drug use: No  . Sexual activity: Not Currently   Other Topics Concern  . Not on file   Social History Narrative  . No narrative on file    Allergies  Allergen Reactions  . Zosyn [Piperacillin Sod-Tazobactam So]     Possible cause of lip swelling.  Tolerating Ancef as of 09/25/15.    Family History  Problem Relation Age of Onset  . Lymphoma Mother   . Lung cancer Father   . Throat cancer Brother     Prior to Admission medications   Medication Sig Start Date End Date Taking? Authorizing Provider  acetaminophen (TYLENOL) 500 MG tablet Take 1,000 mg by mouth every 6 (six) hours as needed for mild pain.    Yes Historical Provider, MD  bisacodyl (DULCOLAX) 10 MG suppository Place 10 mg rectally daily as needed for moderate constipation (for constipation not relieved by Milk of Magnesium).   Yes  Historical Provider, MD  Cholecalciferol (VITAMIN D3) 5000 UNITS CAPS Take 5,000 Units by mouth daily.   Yes Historical Provider, MD  dexamethasone (DECADRON) 1 MG tablet Take 2 mg by mouth 2 (two) times daily.  08/19/15  Yes Historical Provider, MD  feeding supplement, GLUCERNA SHAKE, (GLUCERNA SHAKE) LIQD Take 237 mLs by mouth 2 (two) times daily between meals.   Yes Historical Provider, MD  fentaNYL (DURAGESIC - DOSED MCG/HR) 50 MCG/HR Place 50 mcg onto the skin every 3 (three) days.   Yes Historical Provider, MD  furosemide (LASIX) 40 MG tablet Take 1 tablet (40 mg total) by mouth daily.  09/26/15  Yes Nishant Dhungel, MD  losartan (COZAAR) 50 MG tablet Take 50 mg by mouth daily after supper.  06/20/14  Yes Historical Provider, MD  magnesium hydroxide (MILK OF MAGNESIA) 400 MG/5ML suspension Take 30 mLs by mouth daily as needed (for constipation).   Yes Historical Provider, MD  metFORMIN (GLUCOPHAGE) 500 MG tablet Take 500 mg by mouth 2 (two) times daily with a meal. 06/20/14  Yes Historical Provider, MD  oxyCODONE (OXY IR/ROXICODONE) 5 MG immediate release tablet Take 1 tablet (5 mg total) by mouth every 4 (four) hours as needed for moderate pain. 09/26/15  Yes Nishant Dhungel, MD  oxyCODONE (OXY IR/ROXICODONE) 5 MG immediate release tablet Take 5 mg by mouth 2 (two) times daily. 2000 and 0200.   Yes Historical Provider, MD  promethazine (PHENERGAN) 25 MG tablet Take 25 mg by mouth every 6 (six) hours as needed for nausea or vomiting.   Yes Historical Provider, MD  Sodium Phosphates (RA SALINE ENEMA) 19-7 GM/118ML ENEM Place 1 Bottle rectally daily as needed (for constipation not relieved by Milk of Magnesium and bisacodyl suppository.).   Yes Historical Provider, MD  traMADol (ULTRAM) 50 MG tablet Take 50 mg by mouth every 6 (six) hours as needed for moderate pain.  08/21/15  Yes Historical Provider, MD  fentaNYL (DURAGESIC) 25 MCG/HR patch Place 1 patch (25 mcg total) onto the skin every 3 (three) days. Patient not taking: Reported on 10/31/2015 09/26/15   Louellen Molder, MD    Physical Exam: Vitals:   10/30/2015 0024 11/07/2015 0200 10/29/2015 0232 11/12/2015 0233  BP:  118/85 101/59 101/59  Pulse:   114   Resp:  16 16 16   Temp: 99.2 F (37.3 C)     TempSrc: Rectal     SpO2:   97%      General: Writhing in pain, clearly uncomfortable, confused but moaning although he was able to be redirected with specific questions/statements made directly to him Eyes: closed ENT:  grossly normal hearing, lips & tongue, dry mm Neck:  no LAD, masses or thyromegaly Cardiovascular: tachycardia, no  m/r/g.   Respiratory: + lower lobe ronchi bilaterally. Normal respiratory effort. Abdomen:  soft, ntnd, NABS Skin:  no rash or induration seen on limited exam Musculoskeletal: larked lymphedema of the RLE diffusely Psychiatric: obtunded, moaning in pain, thrashing about at times Neurologic: unable to perform  Labs on Admission: I have personally reviewed following labs and imaging studies  CBC:  Recent Labs Lab 11/02/2015 0111  WBC 25.6*  NEUTROABS 24.3*  HGB 8.7*  HCT 24.8*  MCV 85.5  PLT [id number]   Basic Metabolic Panel:  Recent Labs Lab 10/18/2015 0111  NA 114*  K 4.5  CL 78*  CO2 23  GLUCOSE 104*  BUN 46*  CREATININE 1.67*  CALCIUM 7.9*   GFR: Estimated Creatinine Clearance: 49.5 mL/min (by C-G formula based  on SCr of 1.67 mg/dL). Liver Function Tests:  Recent Labs Lab 10/25/2015 0111  AST 29  ALT 23  ALKPHOS 96  BILITOT 0.8  PROT 6.1*  ALBUMIN 2.6*   No results for input(s): LIPASE, AMYLASE in the last 168 hours. No results for input(s): AMMONIA in the last 168 hours. Coagulation Profile: No results for input(s): INR, PROTIME in the last 168 hours. Cardiac Enzymes: No results for input(s): CKTOTAL, CKMB, CKMBINDEX, TROPONINI in the last 168 hours. BNP (last 3 results) No results for input(s): PROBNP in the last 8760 hours. HbA1C: No results for input(s): HGBA1C in the last 72 hours. CBG: No results for input(s): GLUCAP in the last 168 hours. Lipid Profile: No results for input(s): CHOL, HDL, LDLCALC, TRIG, CHOLHDL, LDLDIRECT in the last 72 hours. Thyroid Function Tests: No results for input(s): TSH, T4TOTAL, FREET4, T3FREE, THYROIDAB in the last 72 hours. Anemia Panel: No results for input(s): VITAMINB12, FOLATE, FERRITIN, TIBC, IRON, RETICCTPCT in the last 72 hours. Urine analysis:    Component Value Date/Time   COLORURINE AMBER (A) 10/24/2015 0256   APPEARANCEUR CLOUDY (A) 10/22/2015 0256   LABSPEC 1.016 10/29/2015 0256   PHURINE 5.5 10/22/2015  0256   GLUCOSEU NEGATIVE 11/05/2015 0256   HGBUR NEGATIVE 11/06/2015 0256   BILIRUBINUR SMALL (A) 10/21/2015 0256   KETONESUR NEGATIVE 11/14/2015 0256   PROTEINUR NEGATIVE 11/07/2015 0256   UROBILINOGEN 0.2 07/08/2014 1624   NITRITE NEGATIVE 11/07/2015 0256   LEUKOCYTESUR NEGATIVE 11/04/2015 0256    Creatinine Clearance: Estimated Creatinine Clearance: 49.5 mL/min (by C-G formula based on SCr of 1.67 mg/dL).  Sepsis Labs: @LABRCNTIP (procalcitonin:4,lacticidven:4) )No results found for this or any previous visit (from the past 240 hour(s)).   Radiological Exams on Admission: Dg Chest 2 View  Result Date: 11/09/2015 CLINICAL DATA:  Mental status change.  Fall yesterday.  Confusion. EXAM: CHEST  2 VIEW COMPARISON:  Chest radiographs 09/26/2015, 09/19/2015 FINDINGS: Development of right infrahilar opacity from prior exam. Right lower lobe bronchial thickening. Left lung is clear. Heart is normal in size. Previous pleural thickening in the right midlung zone is not as well visualized currently. There is no frank pleural effusion. No pneumothorax. The bones appear under mineralized. IMPRESSION: Development of right infrahilar opacity in right lower lobe bronchial thickening. Considerations include pneumonia or aspiration. Recommend continued radiographic follow-up. Electronically Signed   By: Jeb Levering M.D.   On: 11/04/2015 01:16   Ct Head Wo Contrast  Result Date: 11/08/2015 CLINICAL DATA:  60 year old male with altered mental status EXAM: CT HEAD WITHOUT CONTRAST TECHNIQUE: Contiguous axial images were obtained from the base of the skull through the vertex without intravenous contrast. COMPARISON:  None. FINDINGS: Evaluation of this exam is limited due to motion artifact. The ventricles and sulci appropriate size for patient's age. Minimal periventricular and deep white matter chronic microvascular ischemic changes noted. There is no acute intracranial hemorrhage. No mass effect or midline  shift. There is mild mucoperiosteal thickening of the ethmoid air cells. The remainder of the visualized paranasal sinuses and mastoid air cells are clear. The calvarium is intact. IMPRESSION: No acute intracranial hemorrhage. Minimal chronic microvascular ischemic disease. If symptoms persist and there are no contraindications, MRI may provide better evaluation if clinically indicated Electronically Signed   By: Anner Crete M.D.   On: 10/26/2015 01:17    EKG: Independently reviewed.  Sinus tachycardia with rate 113; nonspecific ST changes with no evidence of acute ischemia  Assessment/Plan Active Problems:   Diffuse large B-cell lymphoma (HCC)  Cancer associated pain   Goals of care, counseling/discussion   Acute confusional state   Lymphedema   Diabetes type 2, controlled ([place])   Hyponatremia    Patient with B cell lymphoma with prolonged recent hospitalization for septic shock.  He has been released from oncology care due to terminal condition.  Was last discharged from hospital to SNF and had hospice consultation today.  He has a number of potential correctable conditions at this time - primarily HCAP likely from aspiration PNA and severe hyponatremia.  However, in discussion with his HCPOA it is clear that he has a terminal condition and that even if we correct the potentially treatable conditions, his overall prognosis remains unchanged.  He is extremely uncomfortable at this time and has been despite escalation in pain medication recently. As such, his girlfriend is in agreement with the plan for admission for comfort care.  No interventions including antibiotics, blood draws, IVF.  Will start a morphine drip with boluses as needed until we can determine what is necessary to control his pain.  Other medications as per the comfort care order set.  If patient stabilizes over the next few days, he may be okay to transition to inpatient hospice at [facility].  However, at this time he  would likely benefit from inpatient stabilization first.   DVT prophylaxis: None - comfort care Code Status: DNR - confirmed with patient/family Family Communication: Girlfriend of 60 years/HCPOA at the bedside throughout; he does have a brother who is his only other family Disposition Plan: Possibly to [employer] called: None - suggest hospice for inpatient transfer once stabilized with good pain control Admission status: Admit   Karmen Bongo MD [facility]  If 7PM-7AM, please contact night-coverage [email or web address] Password [email or web address]  10/28/2015, 4:14 AM

## 2015-10-19 NOTE — Progress Notes (Signed)
11/01/2015 12:10 AM  11/13/2015 9:14 AM  Nicki Reaper Kellie Simmering was seen and examined.  The H&P by the admitting provider , orders, imaging was reviewed.  Please see orders.  Will continue to follow.   Murvin Natal, MD [facility]

## 2015-10-19 NOTE — ED Notes (Signed)
Pt has 2 tears in skin on right forearm from writhing on bed rails, wrapped skin and added pads on siderails.

## 2015-10-19 NOTE — Progress Notes (Signed)
Pharmacy Antibiotic Note  Gregery Devincenzi is a 60 y.o. male admitted on 11/06/2015 with pneumonia.  Pharmacy has been consulted for cefepime/vancomycin dosing.  Plan: Cefepime 2gm x1 then 1Gm IV q12h Vancomycin 750mg  IV q12h (VT=15-20 mg/L)     Temp (24hrs), Avg:98.3 F (36.8 C), Min:97.4 F (36.3 C), Max:99.2 F (37.3 C)   Recent Labs Lab 10/29/2015 0111 10/22/2015 0120  WBC 25.6*  --   CREATININE 1.67*  --   LATICACIDVEN  --  1.37    Estimated Creatinine Clearance: 49.5 mL/min (by C-G formula based on SCr of 1.67 mg/dL).    Allergies  Allergen Reactions  . Zosyn [Piperacillin Sod-Tazobactam So]     Possible cause of lip swelling.  Tolerating Ancef as of 09/25/15.    Antimicrobials this admission: 8/5 cefepime >>  8/5 vancomycin >>   Dose adjustments this admission:   Microbiology results:  BCx:   UCx:    Sputum:    MRSA PCR:   Thank you for allowing pharmacy to be a part of this patient's care.  Dorrene German 11/11/2015 2:27 AM

## 2015-10-19 NOTE — Progress Notes (Signed)
PHARMACY - PHYSICIAN COMMUNICATION CRITICAL VALUE ALERT - BLOOD CULTURE IDENTIFICATION (BCID)  Results for orders placed or performed during the hospital encounter of 10/28/2015  Blood Culture ID Panel (Reflexed) (Collected: 11/07/2015  1:15 AM)  Result Value Ref Range   Enterococcus species NOT DETECTED NOT DETECTED   Vancomycin resistance NOT DETECTED NOT DETECTED   Listeria monocytogenes NOT DETECTED NOT DETECTED   Staphylococcus species DETECTED (A) NOT DETECTED   Staphylococcus aureus DETECTED (A) NOT DETECTED   Methicillin resistance NOT DETECTED NOT DETECTED   Streptococcus species NOT DETECTED NOT DETECTED   Streptococcus agalactiae NOT DETECTED NOT DETECTED   Streptococcus pneumoniae NOT DETECTED NOT DETECTED   Streptococcus pyogenes NOT DETECTED NOT DETECTED   Acinetobacter baumannii NOT DETECTED NOT DETECTED   Enterobacteriaceae species NOT DETECTED NOT DETECTED   Enterobacter cloacae complex NOT DETECTED NOT DETECTED   Escherichia coli NOT DETECTED NOT DETECTED   Klebsiella oxytoca NOT DETECTED NOT DETECTED   Klebsiella pneumoniae NOT DETECTED NOT DETECTED   Proteus species NOT DETECTED NOT DETECTED   Serratia marcescens NOT DETECTED NOT DETECTED   Carbapenem resistance NOT DETECTED NOT DETECTED   Haemophilus influenzae NOT DETECTED NOT DETECTED   Neisseria meningitidis NOT DETECTED NOT DETECTED   Pseudomonas aeruginosa NOT DETECTED NOT DETECTED   Candida albicans NOT DETECTED NOT DETECTED   Candida glabrata NOT DETECTED NOT DETECTED   Candida krusei NOT DETECTED NOT DETECTED   Candida parapsilosis NOT DETECTED NOT DETECTED   Candida tropicalis NOT DETECTED NOT DETECTED    Name of physician (or Provider) Contacted: A. Lissa Merlin NP  Changes to prescribed antibiotics required: Initial antibiotics (vanc and cefepime) were stopped earlier today for palliative/Hospice care per family request and poor prognosis.  Ebony Hail NP agrees to continue with NO antibiotics at this  time.   Gretta Arab PharmD, BCPS Pager [phone] 11/02/2015 8:16 PM

## 2015-10-19 NOTE — ED Notes (Addendum)
Pt showing increased confusion, fell at 1600 yesterday. Last seen normal at 1400 yesterday. Hx of confusion. No signs of injury from fall.

## 2015-10-19 NOTE — Progress Notes (Addendum)
[facility] - [id number]   [facility] RN visit .  This is a GIP - related -uncovered   hospital admission of 11/05/2015 per Dr. Tomasa Hosteller with a [facility] dx of Diffuse large B-cell lymphoma.   Code status is DNR .  Patient was just admitted to [facility] on 10/18/15.   Patient was transferred to [facility] last evening from [facility] facility due to moaning with pain and altered mental status following a fall .  Patient was admitted with dx of confusion/altered mental status and was found to have severe hyponatremia as well as pneumonia.  Patient was treated with IFV and IV antibiotics in the ED.   He continued to exhibit sx of uncontrolled pain and patient has been started on a morphine continuous infusion at 5mg /hr with a 4mg  bolus dose. He has received 3 4mg  bolus doses of morphine. The patient also has a 50 mcg Fentanyl patch in place for pain.    Visited patient at bedside.  His brother Anthony Sar and nephew were present.  Patient is noted to be resting with occasional moaning. He does not arouse to conversation. His color is extremely pale. He is not eating and brother stated the patient stopped eating about a week ago.   Per chart review all antibiotics have been discontinued per request of family considering his already very poor prognosis.  Brother did mention that the patient's girlfriend  [name] has stated that the patient will likely be transferred to [facility] for end of life care.  Chart review did confirm that this has been recommended to family.  Brother is in favor of this when patient is able to transfer.     I will contact [facility] to check on bed availability and make them aware of the possible need for this patient to transfer there once ready for discharge.    Spoke with staff RN who did state the patient has finally started to respond to pain medication and appears more comfortable.    [facility] will continue to follow patient until discharge and anticipate any discharge needs.   Please  call with any Hospice related questions or concerns.  Thank you!   Mickie Kay, [facility] Liaison  [phone]

## 2015-10-20 DIAGNOSIS — G893 Neoplasm related pain (acute) (chronic): Secondary | ICD-10-CM

## 2015-10-20 DIAGNOSIS — R339 Retention of urine, unspecified: Secondary | ICD-10-CM

## 2015-10-20 LAB — URINE CULTURE: Culture: NO GROWTH

## 2015-10-20 NOTE — Progress Notes (Signed)
Nutrition Brief Note  Pt identified as at nutrition risk on the Malnutrition Screen Tool  Chart reviewed. Pt now transitioning to comfort care.  No nutrition interventions warranted at this time.  Please consult as needed.   Martin Horsley, MS, RD, LDN Pager: [phone] After Hours Pager: [phone]    

## 2015-10-20 NOTE — Progress Notes (Addendum)
[facility] - [id number]   [facility] RN visit .  This is a GIP - related - uncovered hospital admission of 10/25/2015 per Dr. Tomasa Hosteller with a [facility] dx of Diffuse large B-cell lymphoma.   Code status is DNR .  Patient was just admitted to [facility] on 10/18/15.   Visited patient at bedside. No family present.  Staff RN asking if patient is going to BP today.  Patient is unresponsive.  He remains on a Morphine PCA infusing at 5mg /hr with a 4mg  bolus.  He has received 6 bolus doses in the past 24 hours due to moaning/facial grimacing.  He also has a 50 mcg Fentanyl Patch in place.   All other meds have been discontinued.  Color is extremely pale.   Per chart review he has had no urine output in 24 hours. Staff is preparing to place a catheter.  Patient is having Cheyne Stokes respirations and BP is 73/50. Patient appears to be actively dying at this point.   Discussed with [facility] MD, Dr. Tomasa Hosteller.  Patient would be eligible for [facility], however this patient may be too close to death to transfer.   Discussed with Dr. Wynetta Emery as well and he is in agreement.  TC placed to patient's girlfriend [name] and his brother Jenny Reichmann to update them.  [name] will be here soon.   [facility] will not be able to hold bed for patient but will reevaluate daily.  [facility] will continue for follow.   Please call with any questions or concerns.     Mickie Kay, [facility] Liaison  [phone]

## 2015-10-20 NOTE — Consult Note (Signed)
           [facility] for Infectious Disease    Date of Admission:  10/24/2015            Martin Norton has MSSA bacteremia but is currently under the care of Hospice with orders for comfort care only. His antibiotics were stopped yesterday.  Martin Bickers, MD [facility] [phone] pager   [phone] cell 03/19/2015, 1:32 PM

## 2015-10-20 NOTE — Progress Notes (Signed)
Pt has had no urine output for over 24 hours. Bladder scan shows @200cc  of urine in bladder. Will continue to monitor.

## 2015-10-20 NOTE — Progress Notes (Signed)
PROGRESS NOTE    Martin Norton  [id number]  DOB: 1955/04/26  DOA: 11/09/2015 PCP: Red Christians, MD Outpatient Specialists:  Assessment & Plan:   B cell lymphoma - Treating with comfort care only.  Currently pain is controlled. Updated family at bedside.   Code Status: DNR  Family Communication: GF at bedside.  Disposition Plan: anticipate hospital death  Subjective: Foley placed this morning.   Objective: Vitals:   11/07/2015 0233 10/24/2015 0438 10/27/2015 0630 10/20/15 0650  BP: 101/59 125/75 128/72 (!) 73/50  Pulse:  107 (!) 102 93  Resp: 16 21  (!) 6  Temp:   98 F (36.7 C) 98.9 F (37.2 C)  TempSrc:   Axillary Axillary  SpO2:  96%  92%  Weight:   80.5 kg (177 lb 6.4 oz)   Height:   5\' 11"  (1.803 m)     Intake/Output Summary (Last 24 hours) at 10/20/15 1340 Last data filed at 10/20/15 1147  Gross per 24 hour  Intake                0 ml  Output              600 ml  Net             -600 ml   Filed Weights   11/06/2015 0630  Weight: 80.5 kg (177 lb 6.4 oz)    Exam:  General exam: pt appears comfortable.   Data Reviewed: Basic Metabolic Panel:  Recent Labs Lab 11/10/2015 0111  NA 114*  K 4.5  CL 78*  CO2 23  GLUCOSE 104*  BUN 46*  CREATININE 1.67*  CALCIUM 7.9*   Liver Function Tests:  Recent Labs Lab 10/26/2015 0111  AST 29  ALT 23  ALKPHOS 96  BILITOT 0.8  PROT 6.1*  ALBUMIN 2.6*   No results for input(s): LIPASE, AMYLASE in the last 168 hours. No results for input(s): AMMONIA in the last 168 hours. CBC:  Recent Labs Lab 11/08/2015 0111  WBC 25.6*  NEUTROABS 24.3*  HGB 8.7*  HCT 24.8*  MCV 85.5  PLT 239   Cardiac Enzymes: No results for input(s): CKTOTAL, CKMB, CKMBINDEX, TROPONINI in the last 168 hours. CBG (last 3)  No results for input(s): GLUCAP in the last 72 hours. Recent Results (from the past 240 hour(s))  Culture, blood (routine x 2)     Status: None (Preliminary result)   Collection Time: 10/18/2015  1:15 AM  Result  Value Ref Range Status   Specimen Description BLOOD RIGHT ANTECUBITAL  Final   Special Requests BOTTLES DRAWN AEROBIC AND ANAEROBIC 5ML  Final   Culture  Setup Time   Final    GRAM POSITIVE COCCI IN CLUSTERS IN BOTH AEROBIC AND ANAEROBIC BOTTLES CRITICAL RESULT CALLED TO, READ BACK BY AND VERIFIED WITH: C SHADE,PHARMD AT 2003 11/14/2015 BY L BENFIELD Performed at [place]  Final   Report Status PENDING  Incomplete  Blood Culture ID Panel (Reflexed)     Status: Abnormal   Collection Time: 10/15/2015  1:15 AM  Result Value Ref Range Status   Enterococcus species NOT DETECTED NOT DETECTED Final   Vancomycin resistance NOT DETECTED NOT DETECTED Final   Listeria monocytogenes NOT DETECTED NOT DETECTED Final   Staphylococcus species DETECTED (A) NOT DETECTED Final    Comment: CRITICAL RESULT CALLED TO, READ BACK BY AND VERIFIED WITH:  SHADE,PHARMD AT 2003 11/13/2015 BY L BENFIELD    Staphylococcus  aureus DETECTED (A) NOT DETECTED Final    Comment: CRITICAL RESULT CALLED TO, READ BACK BY AND VERIFIED WITH:  SHADE,PHARMD AT 2003 10/22/2015 BY L BENFIELD    Methicillin resistance NOT DETECTED NOT DETECTED Final   Streptococcus species NOT DETECTED NOT DETECTED Final   Streptococcus agalactiae NOT DETECTED NOT DETECTED Final   Streptococcus pneumoniae NOT DETECTED NOT DETECTED Final   Streptococcus pyogenes NOT DETECTED NOT DETECTED Final   Acinetobacter baumannii NOT DETECTED NOT DETECTED Final   Enterobacteriaceae species NOT DETECTED NOT DETECTED Final   Enterobacter cloacae complex NOT DETECTED NOT DETECTED Final   Escherichia coli NOT DETECTED NOT DETECTED Final   Klebsiella oxytoca NOT DETECTED NOT DETECTED Final   Klebsiella pneumoniae NOT DETECTED NOT DETECTED Final   Proteus species NOT DETECTED NOT DETECTED Final   Serratia marcescens NOT DETECTED NOT DETECTED Final   Carbapenem resistance NOT DETECTED NOT DETECTED Final   Haemophilus influenzae NOT  DETECTED NOT DETECTED Final   Neisseria meningitidis NOT DETECTED NOT DETECTED Final   Pseudomonas aeruginosa NOT DETECTED NOT DETECTED Final   Candida albicans NOT DETECTED NOT DETECTED Final   Candida glabrata NOT DETECTED NOT DETECTED Final   Candida krusei NOT DETECTED NOT DETECTED Final   Candida parapsilosis NOT DETECTED NOT DETECTED Final   Candida tropicalis NOT DETECTED NOT DETECTED Final    Comment: Performed at [facility]  Culture, blood (routine x 2)     Status: None (Preliminary result)   Collection Time: 10/20/2015  2:21 AM  Result Value Ref Range Status   Specimen Description BLOOD RIGHT HAND  Final   Special Requests IN PEDIATRIC BOTTLE 2ML  Final   Culture  Setup Time   Final    GRAM POSITIVE COCCI IN CLUSTERS AEROBIC BOTTLE ONLY CRITICAL RESULT CALLED TO, READ BACK BY AND VERIFIED WITH:  SHADE,PHARMD AT 2003 11/05/2015 BY L BENFIELD Performed at [place]  Final   Report Status PENDING  Incomplete  Urine culture     Status: None   Collection Time: 11/08/2015  2:56 AM  Result Value Ref Range Status   Specimen Description URINE, RANDOM  Final   Special Requests NONE  Final   Culture NO GROWTH Performed at [facility]   Final   Report Status 10/20/2015 FINAL  Final  MRSA PCR Screening     Status: None   Collection Time: 11/03/2015  2:34 PM  Result Value Ref Range Status   MRSA by PCR NEGATIVE NEGATIVE Final    Comment:        The GeneXpert MRSA Assay (FDA approved for NASAL specimens only), is one component of a comprehensive MRSA colonization surveillance program. It is not intended to diagnose MRSA infection nor to guide or monitor treatment for MRSA infections.      Studies: Dg Chest 2 View  Result Date: 11/01/2015 CLINICAL DATA:  Mental status change.  Fall yesterday.  Confusion. EXAM: CHEST  2 VIEW COMPARISON:  Chest radiographs 09/26/2015, 09/19/2015 FINDINGS: Development of right infrahilar opacity  from prior exam. Right lower lobe bronchial thickening. Left lung is clear. Heart is normal in size. Previous pleural thickening in the right midlung zone is not as well visualized currently. There is no frank pleural effusion. No pneumothorax. The bones appear under mineralized. IMPRESSION: Development of right infrahilar opacity in right lower lobe bronchial thickening. Considerations include pneumonia or aspiration. Recommend continued radiographic follow-up. Electronically Signed   By: Fonnie Birkenhead.D.  On: 10/15/2015 01:16   Ct Head Wo Contrast  Result Date: 10/21/2015 CLINICAL DATA:  60 year old male with altered mental status EXAM: CT HEAD WITHOUT CONTRAST TECHNIQUE: Contiguous axial images were obtained from the base of the skull through the vertex without intravenous contrast. COMPARISON:  None. FINDINGS: Evaluation of this exam is limited due to motion artifact. The ventricles and sulci appropriate size for patient's age. Minimal periventricular and deep white matter chronic microvascular ischemic changes noted. There is no acute intracranial hemorrhage. No mass effect or midline shift. There is mild mucoperiosteal thickening of the ethmoid air cells. The remainder of the visualized paranasal sinuses and mastoid air cells are clear. The calvarium is intact. IMPRESSION: No acute intracranial hemorrhage. Minimal chronic microvascular ischemic disease. If symptoms persist and there are no contraindications, MRI may provide better evaluation if clinically indicated Electronically Signed   By: Anner Crete M.D.   On: 11/11/2015 01:17     Scheduled Meds: . dexamethasone  4 mg Oral Q8H   Or  . dexamethasone  4 mg Intravenous Q8H  . fentaNYL  50 mcg Transdermal Q72H  . sodium chloride flush  3 mL Intravenous Q12H   Continuous Infusions: . morphine 5 mg/hr (10/20/15 0800)    Active Problems:   Diffuse large B-cell lymphoma (HCC)   Cancer associated pain   Goals of care,  counseling/discussion   Acute confusional state   Lymphedema   Diabetes type 2, controlled ([place])   Hyponatremia   Diffuse large B cell lymphoma ([place])   Time spent:   Irwin Brakeman, MD, FAAFP [facility] Pager [phone]  If 7PM-7AM, please contact night-coverage [email or web address] Password [email or web address] 10/20/2015, 1:40 PM    LOS: 1 day

## 2015-10-21 DIAGNOSIS — E119 Type 2 diabetes mellitus without complications: Secondary | ICD-10-CM

## 2015-10-21 DIAGNOSIS — E871 Hypo-osmolality and hyponatremia: Secondary | ICD-10-CM

## 2015-10-21 DIAGNOSIS — I890 Lymphedema, not elsewhere classified: Secondary | ICD-10-CM

## 2015-10-21 NOTE — Progress Notes (Signed)
Patient laying in bed and appears comfortable. Morphine gtt maintains at 5 mg/hr. Apneic episode noted. Will continue and monitor patient comfort.

## 2015-10-21 NOTE — Progress Notes (Addendum)
[facility] - [id number] [facility] RN visit   This is a GIP - related, not covered hospital admission of 11/03/2015 per Dr. Tomasa Hosteller with a [facility] dx of Diffuse large B-cell lymphoma. Code status is DNR . Patient was just admitted to [facility] on 10/18/15.  Patient was admitted after a fall with an altered mental state.  Patient seen in room, laying in bed.  No family/visitors present at time of visit.  Patient had an apneic episode of approximately 30 seconds during visit.  He appears comfortable.  He remains unresponsive.  He is on a Morphine gtt at a rate of 5mg /hr infusing via a PIV.  His color remains pale and he has minimal amber urine noted to F/C.  Spoke to [facility], and unfortunately there are no beds available today.  If patient survives overnight, [facility] will re-evaluate for bed placement at [facility] tomorrow pending stability for transfer.    Please call with any hospice-related questions or concerns.  Thank you,  Freddi Starr RN, [facility] Liaison  [phone]

## 2015-10-21 NOTE — Progress Notes (Signed)
PROGRESS NOTE    Martin Norton  [id number]  DOB: November 04, 1955  DOA: 10/29/2015 PCP: Red Christians, MD Outpatient Specialists:  Assessment & Plan:   B cell lymphoma - Treating with comfort care only.  Currently pain is controlled. Updated family at bedside.   Code Status: DNR  Family Communication: GF at bedside.  Disposition Plan: anticipate hospital death  Subjective: Pt appears comfortable.    Objective: Vitals:   11/02/2015 0438 10/28/2015 0630 10/20/15 0650 10/21/15 0644  BP: 125/75 128/72 (!) 73/50 (!) 75/50  Pulse: 107 (!) 102 93 95  Resp: 21  (!) 6 (!) 4  Temp:  98 F (36.7 C) 98.9 F (37.2 C) (!) 96.8 F (36 C)  TempSrc:  Axillary Axillary Axillary  SpO2: 96%  92% 97%  Weight:  80.5 kg (177 lb 6.4 oz)    Height:  5\' 11"  (1.803 m)      Intake/Output Summary (Last 24 hours) at 10/21/15 1705 Last data filed at 10/21/15 0645  Gross per 24 hour  Intake                0 ml  Output              300 ml  Net             -300 ml   Filed Weights   10/15/2015 0630  Weight: 80.5 kg (177 lb 6.4 oz)    Exam:  General exam: pt appears comfortable.   Data Reviewed: Basic Metabolic Panel:  Recent Labs Lab 11/12/2015 0111  NA 114*  K 4.5  CL 78*  CO2 23  GLUCOSE 104*  BUN 46*  CREATININE 1.67*  CALCIUM 7.9*   Liver Function Tests:  Recent Labs Lab 11/04/2015 0111  AST 29  ALT 23  ALKPHOS 96  BILITOT 0.8  PROT 6.1*  ALBUMIN 2.6*   No results for input(s): LIPASE, AMYLASE in the last 168 hours. No results for input(s): AMMONIA in the last 168 hours. CBC:  Recent Labs Lab 10/15/2015 0111  WBC 25.6*  NEUTROABS 24.3*  HGB 8.7*  HCT 24.8*  MCV 85.5  PLT 239   Cardiac Enzymes: No results for input(s): CKTOTAL, CKMB, CKMBINDEX, TROPONINI in the last 168 hours. CBG (last 3)  No results for input(s): GLUCAP in the last 72 hours. Recent Results (from the past 240 hour(s))  Culture, blood (routine x 2)     Status: Abnormal (Preliminary result)   Collection Time: 10/27/2015  1:15 AM  Result Value Ref Range Status   Specimen Description BLOOD RIGHT ANTECUBITAL  Final   Special Requests BOTTLES DRAWN AEROBIC AND ANAEROBIC 5ML  Final   Culture  Setup Time   Final    GRAM POSITIVE COCCI IN CLUSTERS IN BOTH AEROBIC AND ANAEROBIC BOTTLES CRITICAL RESULT CALLED TO, READ BACK BY AND VERIFIED WITH: C SHADE,PHARMD AT 2003 11/07/2015 BY L BENFIELD    Culture (A)  Final    STAPHYLOCOCCUS AUREUS SUSCEPTIBILITIES TO FOLLOW Performed at [facility]    Report Status PENDING  Incomplete  Blood Culture ID Panel (Reflexed)     Status: Abnormal   Collection Time: 10/30/2015  1:15 AM  Result Value Ref Range Status   Enterococcus species NOT DETECTED NOT DETECTED Final   Vancomycin resistance NOT DETECTED NOT DETECTED Final   Listeria monocytogenes NOT DETECTED NOT DETECTED Final   Staphylococcus species DETECTED (A) NOT DETECTED Final    Comment: CRITICAL RESULT CALLED TO, READ BACK BY AND VERIFIED WITH:  SHADE,PHARMD AT 2003 11/13/2015 BY L BENFIELD    Staphylococcus aureus DETECTED (A) NOT DETECTED Final    Comment: CRITICAL RESULT CALLED TO, READ BACK BY AND VERIFIED WITH:  SHADE,PHARMD AT 2003 11/07/2015 BY L BENFIELD    Methicillin resistance NOT DETECTED NOT DETECTED Final   Streptococcus species NOT DETECTED NOT DETECTED Final   Streptococcus agalactiae NOT DETECTED NOT DETECTED Final   Streptococcus pneumoniae NOT DETECTED NOT DETECTED Final   Streptococcus pyogenes NOT DETECTED NOT DETECTED Final   Acinetobacter baumannii NOT DETECTED NOT DETECTED Final   Enterobacteriaceae species NOT DETECTED NOT DETECTED Final   Enterobacter cloacae complex NOT DETECTED NOT DETECTED Final   Escherichia coli NOT DETECTED NOT DETECTED Final   Klebsiella oxytoca NOT DETECTED NOT DETECTED Final   Klebsiella pneumoniae NOT DETECTED NOT DETECTED Final   Proteus species NOT DETECTED NOT DETECTED Final   Serratia marcescens NOT DETECTED NOT DETECTED Final    Carbapenem resistance NOT DETECTED NOT DETECTED Final   Haemophilus influenzae NOT DETECTED NOT DETECTED Final   Neisseria meningitidis NOT DETECTED NOT DETECTED Final   Pseudomonas aeruginosa NOT DETECTED NOT DETECTED Final   Candida albicans NOT DETECTED NOT DETECTED Final   Candida glabrata NOT DETECTED NOT DETECTED Final   Candida krusei NOT DETECTED NOT DETECTED Final   Candida parapsilosis NOT DETECTED NOT DETECTED Final   Candida tropicalis NOT DETECTED NOT DETECTED Final    Comment: Performed at [facility]  Culture, blood (routine x 2)     Status: Abnormal (Preliminary result)   Collection Time: 10/18/2015  2:21 AM  Result Value Ref Range Status   Specimen Description BLOOD RIGHT HAND  Final   Special Requests IN PEDIATRIC BOTTLE 2ML  Final   Culture  Setup Time   Final    GRAM POSITIVE COCCI IN CLUSTERS AEROBIC BOTTLE ONLY CRITICAL RESULT CALLED TO, READ BACK BY AND VERIFIED WITH:  SHADE,PHARMD AT 2003 11/02/2015 BY L BENFIELD Performed at [place] (A)  Final   Report Status PENDING  Incomplete  Urine culture     Status: None   Collection Time: 11/06/2015  2:56 AM  Result Value Ref Range Status   Specimen Description URINE, RANDOM  Final   Special Requests NONE  Final   Culture NO GROWTH Performed at [facility]   Final   Report Status 10/20/2015 FINAL  Final  MRSA PCR Screening     Status: None   Collection Time: 11/12/2015  2:34 PM  Result Value Ref Range Status   MRSA by PCR NEGATIVE NEGATIVE Final    Comment:        The GeneXpert MRSA Assay (FDA approved for NASAL specimens only), is one component of a comprehensive MRSA colonization surveillance program. It is not intended to diagnose MRSA infection nor to guide or monitor treatment for MRSA infections.      Studies: No results found.   Scheduled Meds: . fentaNYL  50 mcg Transdermal Q72H  . sodium chloride flush  3 mL Intravenous Q12H    Continuous Infusions: . morphine 5 mg/hr (10/20/15 0800)    Active Problems:   Diffuse large B-cell lymphoma (HCC)   Cancer associated pain   Goals of care, counseling/discussion   Acute confusional state   Lymphedema   Diabetes type 2, controlled ([place])   Hyponatremia   Diffuse large B cell lymphoma ([place])  Time spent:   Irwin Brakeman, MD, FAAFP [facility] Pager [phone]  If  7PM-7AM, please contact night-coverage [email or web address] Password [email or web address] 10/21/2015, 5:05 PM    LOS: 2 days

## 2015-10-21 NOTE — Progress Notes (Signed)
   10/21/15 1000  Clinical Encounter Type  Visited With Family  Visit Type Initial;Psychological support;Spiritual support;Patient actively dying  Referral From Nurse  Consult/Referral To [facility] (Comment);Grief support Special educational needs teacher)  Stress Factors  Patient Stress Factors Not reviewed  Family Stress Factors Loss;Exhausted   I spoke with the patient's girlfriend and a friend at the patient's bedside per referral by the nurse. They were leaving at the time of my arrival, so the visit was brief.  The patient's partner stated that she was exhausted with work at [employer] and with the patient being in the hospital. She was tearful when speaking about the patient; but has good support from her friends and family members.   Please, contact Spiritual Care for further assistance.   [place] M.Div.

## 2015-10-22 LAB — CULTURE, BLOOD (ROUTINE X 2)

## 2015-10-22 NOTE — Progress Notes (Signed)
[facility] - [id number] [facility]GIP RN visit   This is a GIP - related, not covered hospital admission of 11/06/2015 per Dr. Tomasa Hosteller with a [facility] dx of Diffuse large B-cell lymphoma. Code status is DNR . Patient was just admitted to [facility] on 10/18/15.  Patient was admitted after a fall with an altered mental state  Visited patient at bedside. Girlfriend [name] is present along with her girlfriend.  Patient remains unresponsive.  Respirations very shallow, agonal.  He continues on a  Morphine PCA infusing at 5mg /hr .  He is no longer moaning - per chart review has not received any more bolus doses since yesterday.  Color pasty with some faint mottling noted to lower extremities.    [name] asking if its ok for her to return to work.  I did tell her that he is close to death and it can happen at any time but that it is her decision whether or not she wants to be there.  She verbalized that she has given the patient "permission to go".  She is aware that death is imminent and states "she wants him to go to a better place".    Emotional support offered -    Spoke with  Staff RN requesting notification at time of death.    [facility] will continue to follow  - please call with any questions.   Mickie Kay, [facility] Liaison  [phone]

## 2015-10-22 NOTE — Care Management Note (Addendum)
Case Management Note  Patient Details  Name: Quincy Gennuso MRN: [id number] Date of Birth: 1955-12-28  Subjective/Objective:   60 yo admitted with Acute confusional state. Hx of lymphoma                 Action/Plan: Pt from SNF. Active with [facility] at the facility. Anticipate hospital death per MD note. CM will continue to follow along.  Expected Discharge Date:                  Expected Discharge Plan:  [place]  In-House Referral:     Discharge planning Services  CM Consult  Post Acute Care Choice:    Choice offered to:     DME Arranged:    DME Agency:     HH Arranged:    [place] Agency:     Status of Service:  In process, will continue to follow  If discussed at Long Length of Stay Meetings, dates discussed:    Additional CommentsLynnell Catalan, RN 10/22/2015, 12:43 PM  [phone]

## 2015-10-22 NOTE — Progress Notes (Signed)
PROGRESS NOTE    Martin Norton  [id number]  DOB: Sep 01, 1955  DOA: 10/18/2015 PCP: Red Christians, MD Outpatient Specialists:  Assessment & Plan:   B cell lymphoma - Treating with comfort care only.  Currently pain is controlled. Updated family at bedside.   Code Status: DNR  Family Communication: GF at bedside.  Disposition Plan: anticipate hospital death  Subjective: Pt appears comfortable.  He is having periods of apneic episodes.    Objective: Vitals:   10/26/2015 0630 10/20/15 0650 10/21/15 0644 10/22/15 0538  BP: 128/72 (!) 73/50 (!) 75/50 (!) 48/20  Pulse: (!) 102 93 95 (!) 101  Resp:  (!) 6 (!) 4 (!) 6  Temp: 98 F (36.7 C) 98.9 F (37.2 C) (!) 96.8 F (36 C) 98.4 F (36.9 C)  TempSrc: Axillary Axillary Axillary Axillary  SpO2:  92% 97% (!) 66%  Weight: 80.5 kg (177 lb 6.4 oz)     Height: 5\' 11"  (1.803 m)       Intake/Output Summary (Last 24 hours) at 10/22/15 0854 Last data filed at 10/21/15 1739  Gross per 24 hour  Intake                0 ml  Output              100 ml  Net             -100 ml   Filed Weights   11/01/2015 0630  Weight: 80.5 kg (177 lb 6.4 oz)    Exam:  General exam: pt appears comfortable.   Data Reviewed: Basic Metabolic Panel:  Recent Labs Lab 11/14/2015 0111  NA 114*  K 4.5  CL 78*  CO2 23  GLUCOSE 104*  BUN 46*  CREATININE 1.67*  CALCIUM 7.9*   Liver Function Tests:  Recent Labs Lab 10/31/2015 0111  AST 29  ALT 23  ALKPHOS 96  BILITOT 0.8  PROT 6.1*  ALBUMIN 2.6*   No results for input(s): LIPASE, AMYLASE in the last 168 hours. No results for input(s): AMMONIA in the last 168 hours. CBC:  Recent Labs Lab 11/12/2015 0111  WBC 25.6*  NEUTROABS 24.3*  HGB 8.7*  HCT 24.8*  MCV 85.5  PLT 239   Cardiac Enzymes: No results for input(s): CKTOTAL, CKMB, CKMBINDEX, TROPONINI in the last 168 hours. CBG (last 3)  No results for input(s): GLUCAP in the last 72 hours. Recent Results (from the past 240  hour(s))  Culture, blood (routine x 2)     Status: Abnormal (Preliminary result)   Collection Time: 10/21/2015  1:15 AM  Result Value Ref Range Status   Specimen Description BLOOD RIGHT ANTECUBITAL  Final   Special Requests BOTTLES DRAWN AEROBIC AND ANAEROBIC 5ML  Final   Culture  Setup Time   Final    GRAM POSITIVE COCCI IN CLUSTERS IN BOTH AEROBIC AND ANAEROBIC BOTTLES CRITICAL RESULT CALLED TO, READ BACK BY AND VERIFIED WITH: C SHADE,PHARMD AT 2003 11/06/2015 BY L BENFIELD    Culture (A)  Final    STAPHYLOCOCCUS AUREUS SUSCEPTIBILITIES TO FOLLOW Performed at [facility]    Report Status PENDING  Incomplete  Blood Culture ID Panel (Reflexed)     Status: Abnormal   Collection Time: 10/22/2015  1:15 AM  Result Value Ref Range Status   Enterococcus species NOT DETECTED NOT DETECTED Final   Vancomycin resistance NOT DETECTED NOT DETECTED Final   Listeria monocytogenes NOT DETECTED NOT DETECTED Final   Staphylococcus species DETECTED (A) NOT  DETECTED Final    Comment: CRITICAL RESULT CALLED TO, READ BACK BY AND VERIFIED WITH:  SHADE,PHARMD AT 2003 10/20/2015 BY L BENFIELD    Staphylococcus aureus DETECTED (A) NOT DETECTED Final    Comment: CRITICAL RESULT CALLED TO, READ BACK BY AND VERIFIED WITH:  SHADE,PHARMD AT 2003 11/03/2015 BY L BENFIELD    Methicillin resistance NOT DETECTED NOT DETECTED Final   Streptococcus species NOT DETECTED NOT DETECTED Final   Streptococcus agalactiae NOT DETECTED NOT DETECTED Final   Streptococcus pneumoniae NOT DETECTED NOT DETECTED Final   Streptococcus pyogenes NOT DETECTED NOT DETECTED Final   Acinetobacter baumannii NOT DETECTED NOT DETECTED Final   Enterobacteriaceae species NOT DETECTED NOT DETECTED Final   Enterobacter cloacae complex NOT DETECTED NOT DETECTED Final   Escherichia coli NOT DETECTED NOT DETECTED Final   Klebsiella oxytoca NOT DETECTED NOT DETECTED Final   Klebsiella pneumoniae NOT DETECTED NOT DETECTED Final   Proteus species NOT  DETECTED NOT DETECTED Final   Serratia marcescens NOT DETECTED NOT DETECTED Final   Carbapenem resistance NOT DETECTED NOT DETECTED Final   Haemophilus influenzae NOT DETECTED NOT DETECTED Final   Neisseria meningitidis NOT DETECTED NOT DETECTED Final   Pseudomonas aeruginosa NOT DETECTED NOT DETECTED Final   Candida albicans NOT DETECTED NOT DETECTED Final   Candida glabrata NOT DETECTED NOT DETECTED Final   Candida krusei NOT DETECTED NOT DETECTED Final   Candida parapsilosis NOT DETECTED NOT DETECTED Final   Candida tropicalis NOT DETECTED NOT DETECTED Final    Comment: Performed at [facility]  Culture, blood (routine x 2)     Status: Abnormal (Preliminary result)   Collection Time: 11/09/2015  2:21 AM  Result Value Ref Range Status   Specimen Description BLOOD RIGHT HAND  Final   Special Requests IN PEDIATRIC BOTTLE 2ML  Final   Culture  Setup Time   Final    GRAM POSITIVE COCCI IN CLUSTERS AEROBIC BOTTLE ONLY CRITICAL RESULT CALLED TO, READ BACK BY AND VERIFIED WITH:  SHADE,PHARMD AT 2003 10/17/2015 BY L BENFIELD Performed at [facility] (A)  Final   Report Status PENDING  Incomplete  Urine culture     Status: None   Collection Time: 11/08/2015  2:56 AM  Result Value Ref Range Status   Specimen Description URINE, RANDOM  Final   Special Requests NONE  Final   Culture NO GROWTH Performed at [facility]   Final   Report Status 10/20/2015 FINAL  Final  MRSA PCR Screening     Status: None   Collection Time: 10/18/2015  2:34 PM  Result Value Ref Range Status   MRSA by PCR NEGATIVE NEGATIVE Final    Comment:        The GeneXpert MRSA Assay (FDA approved for NASAL specimens only), is one component of a comprehensive MRSA colonization surveillance program. It is not intended to diagnose MRSA infection nor to guide or monitor treatment for MRSA infections.      Studies: No results found.   Scheduled Meds: . fentaNYL   50 mcg Transdermal Q72H  . sodium chloride flush  3 mL Intravenous Q12H   Continuous Infusions: . morphine 5 mg/hr (10/21/15 1755)    Active Problems:   Diffuse large B-cell lymphoma (HCC)   Cancer associated pain   Goals of care, counseling/discussion   Acute confusional state   Lymphedema   Diabetes type 2, controlled (HCC)   Hyponatremia   Diffuse large B cell lymphoma (  [place])  Time spent:   Irwin Brakeman, MD, FAAFP [facility] Pager [phone]  If 7PM-7AM, please contact night-coverage [email or web address] Password [email or web address] 10/22/2015, 8:54 AM    LOS: 3 days

## 2015-10-23 ENCOUNTER — Encounter: Payer: Self-pay | Admitting: Internal Medicine

## 2015-10-23 DIAGNOSIS — C8338 Diffuse large B-cell lymphoma, lymph nodes of multiple sites: Secondary | ICD-10-CM

## 2015-11-15 NOTE — Progress Notes (Signed)
RN, Tommie Raymond, paged because pt expired at [place], pronounced by 2 RNs. Death was expected and pt was comfort care. Death certificate completed and given to [facility]  KJKG, NP [facility]

## 2015-11-15 NOTE — Discharge Summary (Signed)
Expiration Note DISCHARGE SUMMARY [name]  MR#: [id number]  DOB:1955/05/19  Date of Admission: Oct 20, 2015 Date of Death: October 24, 2015  Attending [facility]  Patient's PCP: Red Christians, MD  Cause of Death: B cell lymphoma   Secondary Diagnoses Present on Admission: . Acute confusional state . Cancer associated pain . Diffuse large B-cell lymphoma ([place]) . Hyponatremia . Diffuse large B cell lymphoma ([place]) Chronic Cancer Related Pain   Hospital Course:  Martin Norton is a 60 y.o. male with medical history significant of diffuse large B-cell lymphoma, end stage.  Patient is accompanied by his girlfriend/HCPOA.  He was admitted for septic shock during last hospitalization, 7/6-7/14.  Was discharged to SNF for rehab.  They found him on the floor at SNF, he had fallen out of bed.  Was just admitted to Hospice today.  HCPOA is in favor of proceeding with comfort care at this time with the understanding that while he may have potentially treatable acute conditions, his terminal diagnosis means that treatment for the acute conditions is likely to cause more harm than benefit.  ED Course:  Progressively less active over the last week with increasing pain despite titration of his pain medication.  Found to have profound hyponatremia (Na 119), likely resulting from no PO intake but ongoing diuretic use; IVF started. RN, Tommie Raymond, paged because pt expired at [place], pronounced by 2 RNs. Death was expected and pt was comfort care.  Pt was admitted and started comfort care treatment. He was on an IV morphine infusion and every effort was made to keep him comfortable.  He expired 0100 at Oct 24, 2015.   Signed: Clanford Johnson 10/24/2015, 9:31 AM

## 2015-11-15 NOTE — Progress Notes (Signed)
Patient appears comfortable, laying in bed. Comfort care provided with morphine gtt at 5 mg/hr. Apneic episode noted. Will continue to monitor and assess patient comfort.

## 2015-11-15 NOTE — Progress Notes (Addendum)
Martin Norton, HPOA notified of this pt expiration. She requested [name]. No answer to [name] at this time.  Left message on [name]'s answer machine. Two personal items of patient, one bracelet and one straight walking cane at nursing station and to be pick up by [employer].

## 2015-11-15 NOTE — Assessment & Plan Note (Signed)
Spoke with pt at length, he does want Hospice now, he did not last week during a conversation. He has had diarrhea, will check for C diff. Pt's pain is not controlled; will inc Fentanyl patch to 50 mcg and start getting schedules oxycodone at night when pt needs it most, in addition to q6 prn; Hospice consult requested

## 2015-11-15 NOTE — Progress Notes (Signed)
Martin Norton returned call. Informed of Martin Norton of his brother expiratiion. [name] stated, " I like for [name] to make all the necessary arrangements for my brother."

## 2015-11-15 NOTE — Progress Notes (Signed)
Patient expired at this time and pronounced by 2 RNs, this nurse and [place]. MD notified and CDS called for possible donations of organs, tissues and eyes.

## 2015-11-15 NOTE — Progress Notes (Signed)
Morphine 135 ml wasted and witness by Rosalee Kaufman RN

## 2015-11-15 DEATH — deceased

## 2017-12-17 IMAGING — MR MR FEMUR*R* WO/W CM
4 of 9 series · 19 of 40 positions shown · IV contrast (0)
Comparison: None.

CLINICAL DATA: Right femur pain for 18 months. History of lymphoma.

EXAM:
MRI OF THE RIGHT FEMUR WITHOUT AND WITH CONTRAST
TECHNIQUE: Multiplanar, multisequence MR imaging of the lower right extremity
was performed both before and after administration of intravenous
contrast.
CONTRAST:  18mL MULTIHANCE GADOBENATE DIMEGLUMINE 529 MG/ML IV SOLN

[Series 5: T1 · axial · 7.0mm · 0.90mm/px · z∈[-263,+196]mm · 6 of 52 slices shown]
[im 1/52]
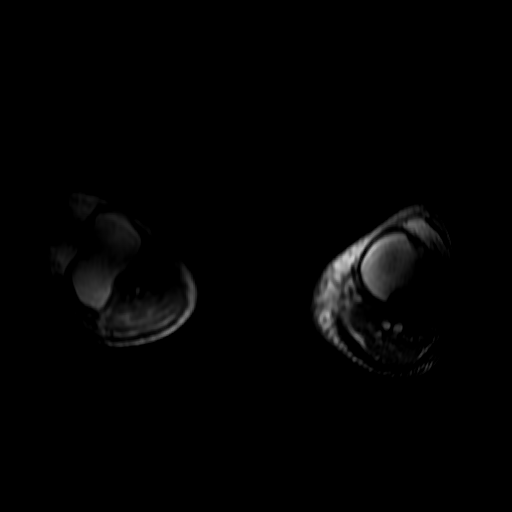
[im 11/52]
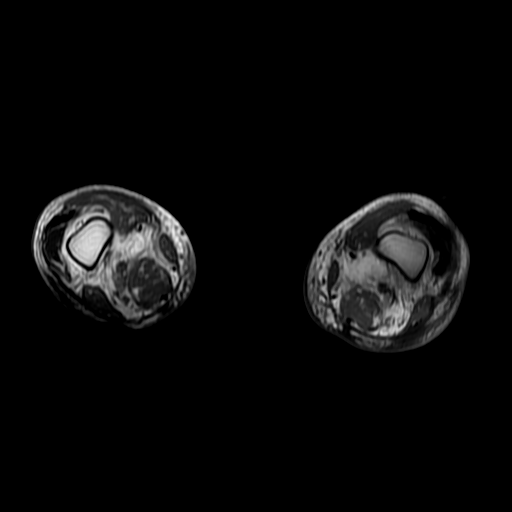
[im 21/52]
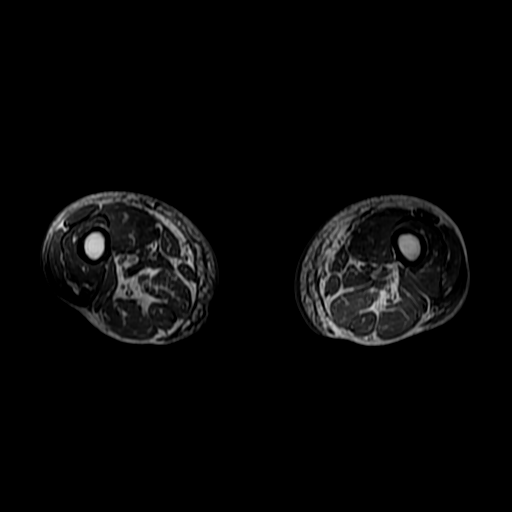
[im 31/52]
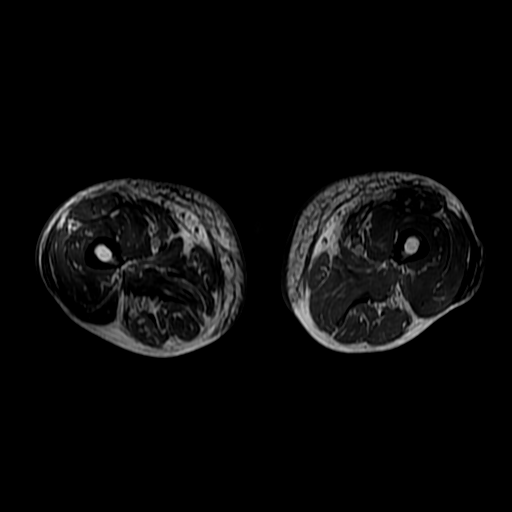
[im 41/52]
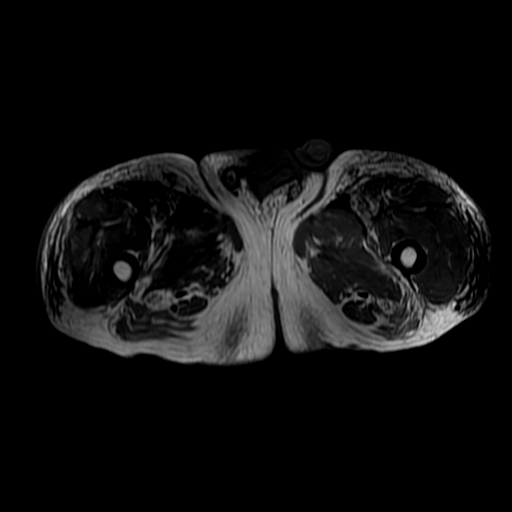
[im 52/52]
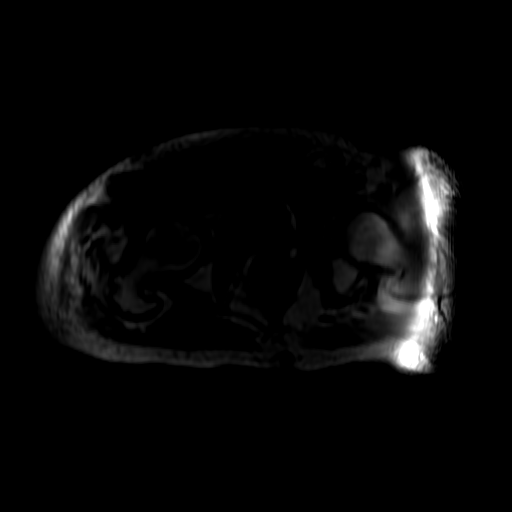

[Series 7: T1 fat-sat · axial · 7.0mm · 0.90mm/px · z∈[-263,+196]mm · 5 of 52 slices shown]
[im 1/52]
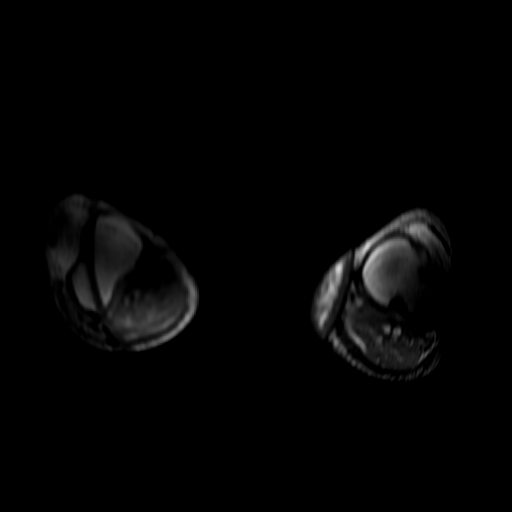
[im 11/52]
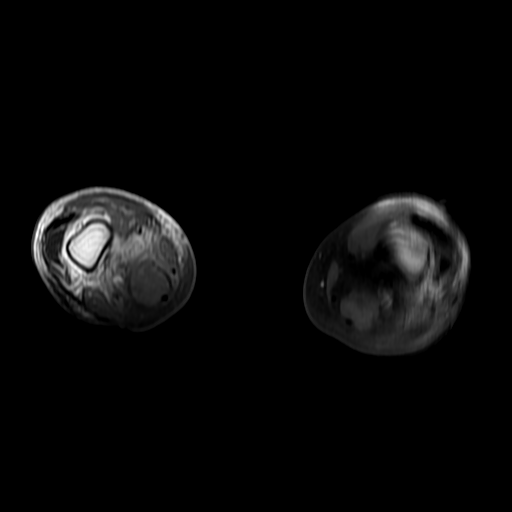
[im 21/52]
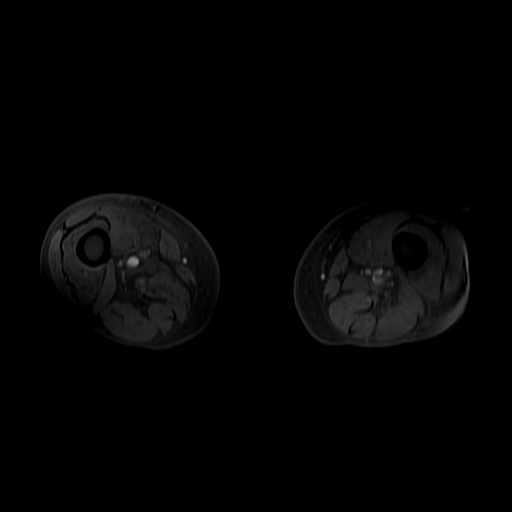
[im 31/52]
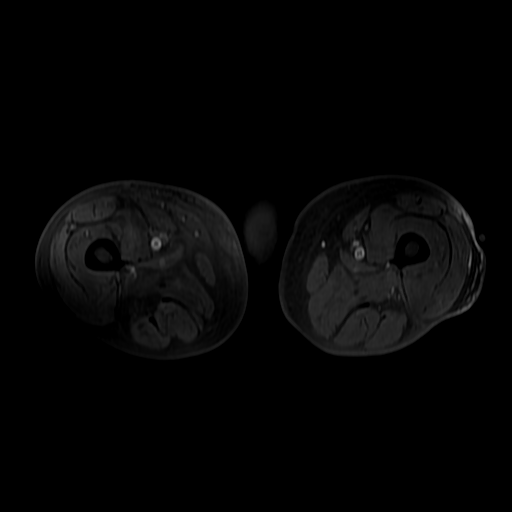
[im 52/52]
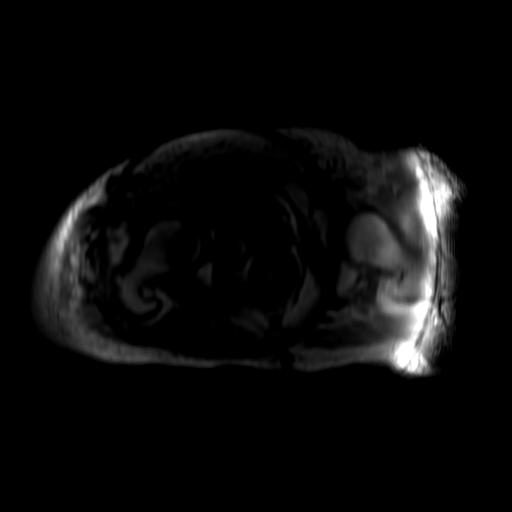

[Series 10: T2 fat-sat · sagittal · 5.0mm · 0.88mm/px · 3 of 31 slices shown (1 of 2)]
[im 1/31]
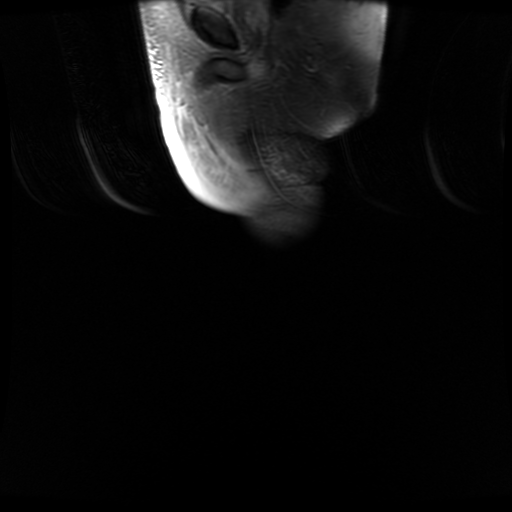
[im 16/31]
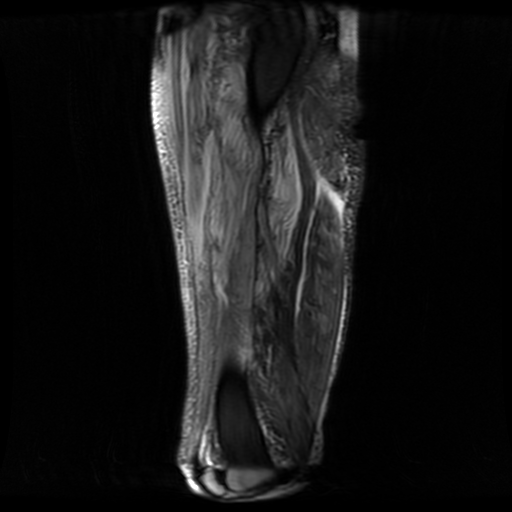
[im 31/31]
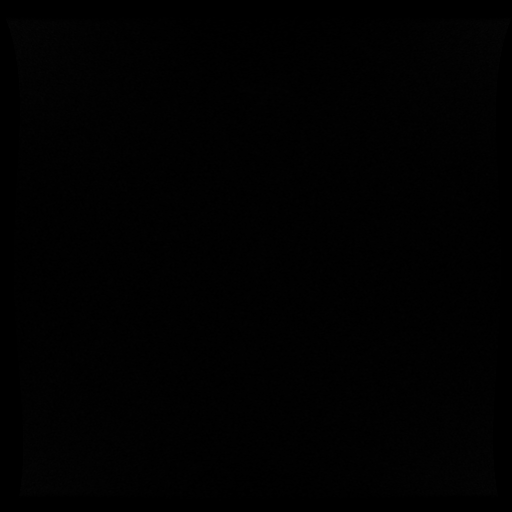

[Series 11: T2 fat-sat · axial · 7.0mm · 0.90mm/px · z∈[-263,+196]mm · 5 of 52 slices shown (2 of 2)]
[im 1/52]
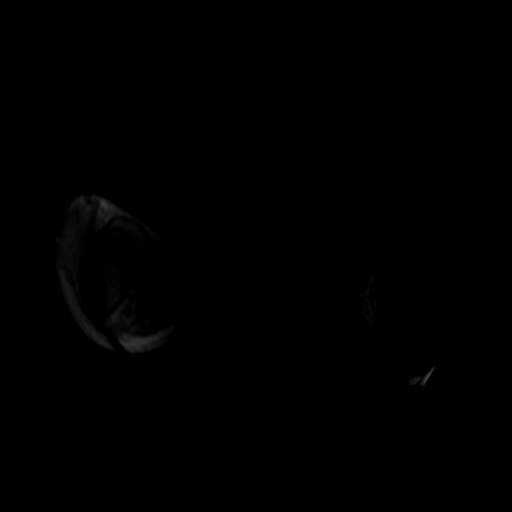
[im 13/52]
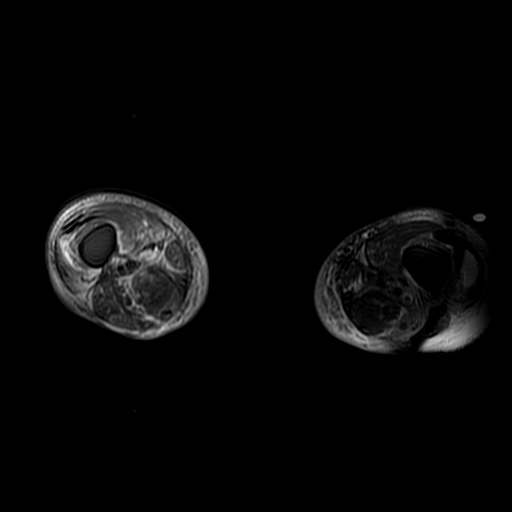
[im 26/52]
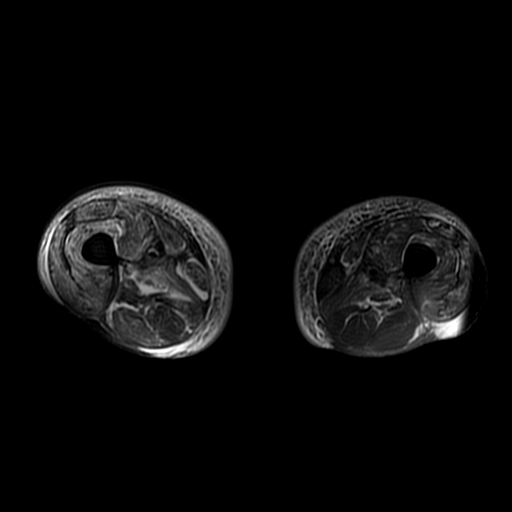
[im 39/52]
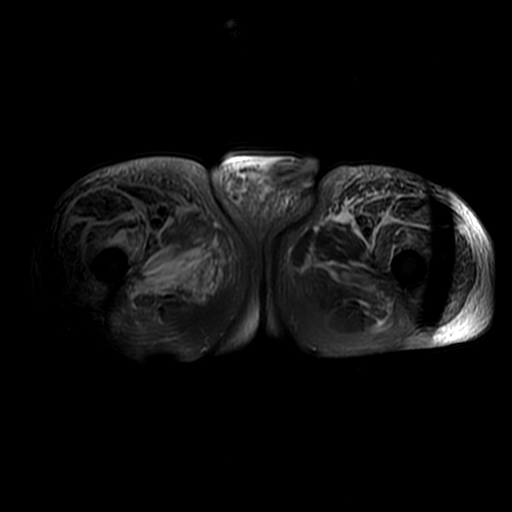
[im 52/52]
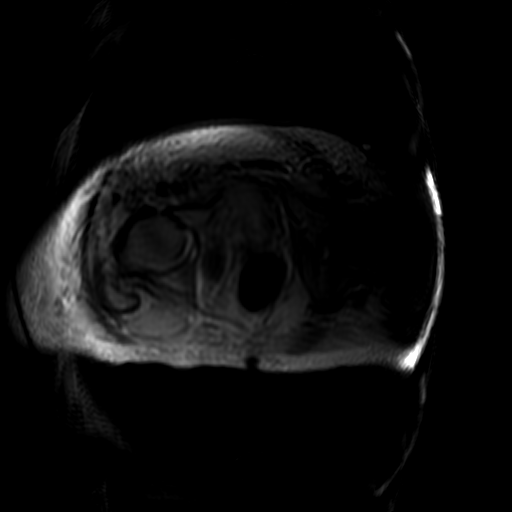

[19 of 40 positions shown; findings below may reference images not displayed]

FINDINGS: There is an enhancing 3.6 x 5.1 x 2.9 cm soft tissue mass in the
right buttock deep to and possibly involving the right gluteus
maximus muscle immediately adjacent to the right sciatic nerve.

The patient has extensive patchy edema throughout muscles of both
thighs, more prominent on the right than the left. There is
asymmetric increased edema of the right adductor muscles of the
right side of the pelvis. The femur appears normal. No discrete hip
effusion.

Moderate bilateral knee effusions.

There is circumferential subcutaneous edema and both thighs and in
the scrotum.
IMPRESSION: 1. Soft tissue mass immediately adjacent to the right sciatic nerve
posterior to the right acetabulum and deep to the right gluteus
maximus muscle consistent with lymphoma.
2. Asymmetric patchy extensive edema in the muscles of both thighs,
right greater than left. This is consistent with nonspecific
myositis.

## 2017-12-23 IMAGING — DX DG CHEST 1V PORT
1 series · 1 of 1 positions shown · non-contrast
Comparison: 09/19/2015

CLINICAL DATA: PICC line placement

EXAM:
PORTABLE CHEST 1 VIEW

[chest ap]
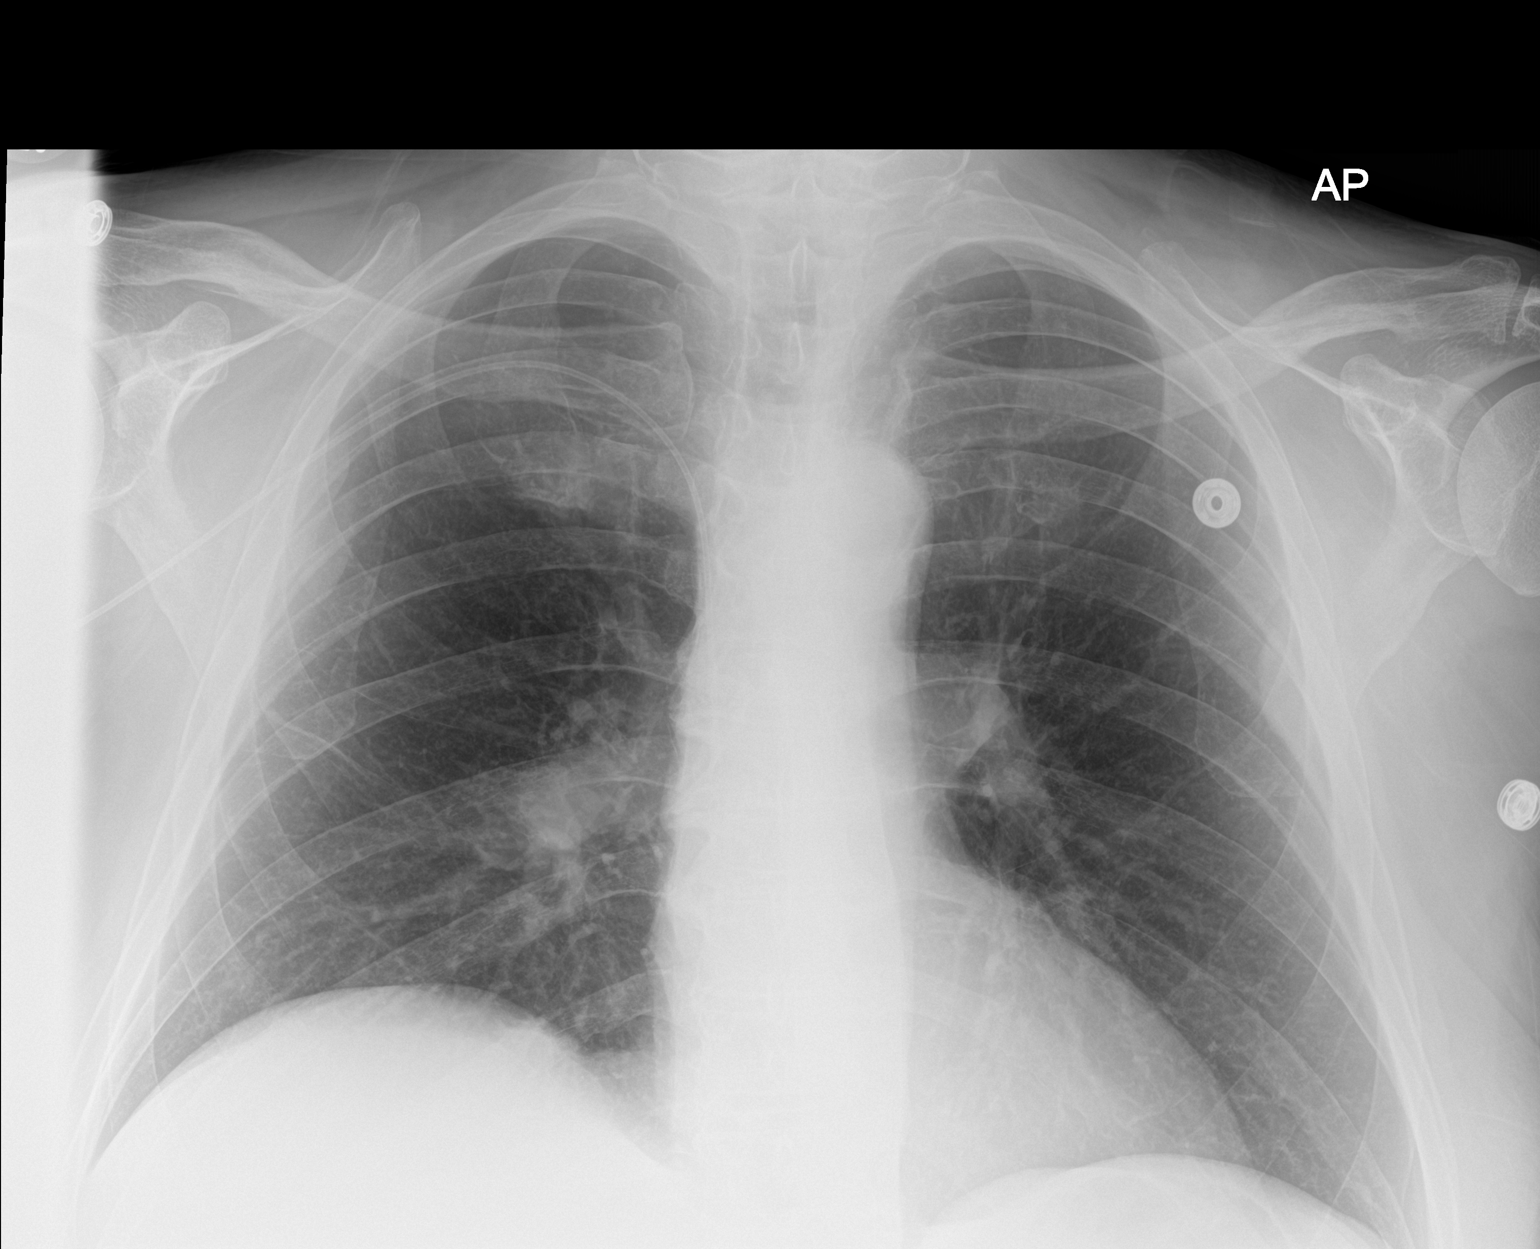

[1 of 1 positions shown; findings below may reference images not displayed]

FINDINGS: Cardiomediastinal silhouette is stable. No acute infiltrate or
pulmonary edema. There is right arm PICC line with tip in distal
SVC. No pneumothorax.
IMPRESSION: Right PICC line in place.  No pneumothorax.
